# Patient Record
Sex: Female | Born: 1990 | Race: White | Hispanic: No | Marital: Single | Smoking: Current every day smoker
Health system: Southern US, Community
[De-identification: ages and names within clinical notes are randomized; demographics above are authoritative.]

## PROBLEM LIST (undated history)

## (undated) ENCOUNTER — Inpatient Hospital Stay (HOSPITAL_COMMUNITY): Payer: Self-pay

## (undated) ENCOUNTER — Inpatient Hospital Stay (HOSPITAL_COMMUNITY): Admission: AD | Payer: Medicaid Other | Admitting: Obstetrics & Gynecology

## (undated) DIAGNOSIS — R87629 Unspecified abnormal cytological findings in specimens from vagina: Secondary | ICD-10-CM

## (undated) DIAGNOSIS — Z202 Contact with and (suspected) exposure to infections with a predominantly sexual mode of transmission: Secondary | ICD-10-CM

## (undated) DIAGNOSIS — F419 Anxiety disorder, unspecified: Secondary | ICD-10-CM

## (undated) DIAGNOSIS — I10 Essential (primary) hypertension: Secondary | ICD-10-CM

## (undated) DIAGNOSIS — Z87448 Personal history of other diseases of urinary system: Secondary | ICD-10-CM

## (undated) HISTORY — PX: WISDOM TOOTH EXTRACTION: SHX21

## (undated) HISTORY — DX: Contact with and (suspected) exposure to infections with a predominantly sexual mode of transmission: Z20.2

## (undated) HISTORY — PX: TOOTH EXTRACTION: SUR596

## (undated) HISTORY — DX: Personal history of other diseases of urinary system: Z87.448

## (undated) HISTORY — PX: DILATION AND CURETTAGE OF UTERUS: SHX78

## (undated) HISTORY — DX: Anxiety disorder, unspecified: F41.9

---

## 2010-09-07 ENCOUNTER — Emergency Department (HOSPITAL_COMMUNITY)
Admission: EM | Admit: 2010-09-07 | Discharge: 2010-09-07 | Payer: Self-pay | Source: Home / Self Care | Admitting: Emergency Medicine

## 2010-09-08 ENCOUNTER — Inpatient Hospital Stay (HOSPITAL_COMMUNITY)
Admission: AD | Admit: 2010-09-08 | Discharge: 2010-09-08 | Payer: Self-pay | Source: Home / Self Care | Attending: Obstetrics and Gynecology | Admitting: Obstetrics and Gynecology

## 2010-11-23 LAB — URINALYSIS, ROUTINE W REFLEX MICROSCOPIC
Bilirubin Urine: NEGATIVE
Bilirubin Urine: NEGATIVE
Glucose, UA: NEGATIVE mg/dL
Ketones, ur: NEGATIVE mg/dL
Leukocytes, UA: NEGATIVE
Nitrite: NEGATIVE
Nitrite: NEGATIVE
Specific Gravity, Urine: 1.022 (ref 1.005–1.030)
Specific Gravity, Urine: 1.02 (ref 1.005–1.030)
pH: 5 (ref 5.0–8.0)

## 2010-11-23 LAB — URINE MICROSCOPIC-ADD ON

## 2010-11-23 LAB — COMPREHENSIVE METABOLIC PANEL
ALT: 17 U/L (ref 0–35)
Albumin: 4 g/dL (ref 3.5–5.2)
Alkaline Phosphatase: 71 U/L (ref 39–117)
BUN: 9 mg/dL (ref 6–23)
Calcium: 9 mg/dL (ref 8.4–10.5)
GFR calc Af Amer: >60 mL/min (ref >60)
GFR calc non Af Amer: >60 mL/min (ref >60)
Glucose, Bld: 106 mg/dL — ABNORMAL HIGH (ref 70–99)
Potassium: 4.2 mEq/L (ref 3.5–5.1)

## 2010-11-23 LAB — CBC
Hemoglobin: 14.3 g/dL (ref 12.0–15.0)
MCH: 30.7 pg (ref 26.0–34.0)
MCH: 30.8 pg (ref 26.0–34.0)
MCHC: 34 g/dL (ref 30.0–36.0)
MCHC: 34.6 g/dL (ref 30.0–36.0)
MCV: 90.1 fL (ref 78.0–100.0)
MCV: 89 fL (ref 78.0–100.0)
Platelets: 298 10*3/uL (ref 150–400)
Platelets: 328 10*3/uL (ref 150–400)
RBC: 4.74 MIL/uL (ref 3.87–5.11)
RDW: 12.5 % (ref 11.5–15.5)
RDW: 12.3 % (ref 11.5–15.5)

## 2010-11-23 LAB — POCT PREGNANCY, URINE: Preg Test, Ur: NEGATIVE

## 2010-11-23 LAB — DIFFERENTIAL
Eosinophils Absolute: 0 10*3/uL (ref 0.0–0.7)
Eosinophils Relative: 0 % (ref 0–5)
Lymphs Abs: 0.7 10*3/uL (ref 0.7–4.0)
Monocytes Absolute: 1.5 10*3/uL — ABNORMAL HIGH (ref 0.1–1.0)
Monocytes Relative: 9 % (ref 3–12)
Neutro Abs: 15.1 10*3/uL — ABNORMAL HIGH (ref 1.7–7.7)
Neutrophils Relative %: 87 % — ABNORMAL HIGH (ref 43–77)

## 2010-11-23 LAB — GC/CHLAMYDIA PROBE AMP, GENITAL: Chlamydia, DNA Probe: NEGATIVE

## 2010-11-23 LAB — LIPASE, BLOOD: Lipase: 38 U/L (ref 11–59)

## 2011-05-24 ENCOUNTER — Encounter (HOSPITAL_COMMUNITY): Payer: Self-pay | Admitting: *Deleted

## 2011-05-24 ENCOUNTER — Inpatient Hospital Stay (HOSPITAL_COMMUNITY)
Admission: AD | Admit: 2011-05-24 | Discharge: 2011-05-24 | Disposition: A | Discharge status: Home / Self Care | Payer: Self-pay | Source: Ambulatory Visit | Attending: Obstetrics and Gynecology | Admitting: Obstetrics and Gynecology

## 2011-05-24 DIAGNOSIS — R109 Unspecified abdominal pain: Secondary | ICD-10-CM | POA: Insufficient documentation

## 2011-05-24 DIAGNOSIS — N899 Noninflammatory disorder of vagina, unspecified: Secondary | ICD-10-CM | POA: Insufficient documentation

## 2011-05-24 DIAGNOSIS — N390 Urinary tract infection, site not specified: Secondary | ICD-10-CM

## 2011-05-24 HISTORY — DX: Essential (primary) hypertension: I10

## 2011-05-24 LAB — CBC
HCT: 41.2 % (ref 36.0–46.0)
MCH: 31.4 pg (ref 26.0–34.0)
MCV: 93.6 fL (ref 78.0–100.0)
Platelets: 329 10*3/uL (ref 150–400)
RBC: 4.4 MIL/uL (ref 3.87–5.11)

## 2011-05-24 LAB — WET PREP, GENITAL

## 2011-05-24 LAB — URINE MICROSCOPIC-ADD ON

## 2011-05-24 LAB — URINALYSIS, ROUTINE W REFLEX MICROSCOPIC
Hgb urine dipstick: NEGATIVE
Ketones, ur: NEGATIVE mg/dL
Specific Gravity, Urine: >1.03 — ABNORMAL HIGH (ref 1.005–1.030)

## 2011-05-24 LAB — POCT PREGNANCY, URINE: Preg Test, Ur: NEGATIVE

## 2011-05-24 LAB — DIFFERENTIAL
Basophils Absolute: 0 10*3/uL (ref 0.0–0.1)
Basophils Relative: 0 % (ref 0–1)
Eosinophils Absolute: 0.1 10*3/uL (ref 0.0–0.7)
Eosinophils Relative: 1 % (ref 0–5)
Lymphocytes Relative: 13 % (ref 12–46)
Lymphs Abs: 1.4 10*3/uL (ref 0.7–4.0)
Monocytes Relative: 10 % (ref 3–12)

## 2011-05-24 MED ORDER — CEPHALEXIN 500 MG PO CAPS: 500.0000 mg | ORAL_CAPSULE | Freq: Four times a day (QID) | ORAL | Status: AC

## 2011-05-24 NOTE — Discharge Instructions (Signed)
Urinary Tract Infection (UTI)   Infections of the urinary tract can start in several places. A bladder infection (cystitis), a kidney infection (pyelonephritis), and a prostate infection (prostatitis) are different types of urinary tract infections. They usually get better if treated with medicines (antibiotics) that kill germs. Take all the medicine until it is gone. You or your child may feel better in a few days, but TAKE ALL MEDICINE or the infection may not respond and may become more difficult to treat.   HOME CARE INSTRUCTIONS   Drink enough water and fluids to keep the urine clear or pale yellow. Cranberry juice is especially recommended, in addition to large amounts of water.   Avoid caffeine, tea, and carbonated beverages. They tend to irritate the bladder.   Alcohol may irritate the prostate.   Only take over-the-counter or prescription medicines for pain, discomfort, or fever as directed by your caregiver.   FINDING OUT THE RESULTS OF YOUR TEST   Not all test results are available during your visit. If your or your child's test results are not back during the visit, make an appointment with your caregiver to find out the results. Do not assume everything is normal if you have not heard from your caregiver or the medical facility. It is important for you to follow up on all test results.   TO PREVENT FURTHER INFECTIONS:   Empty the bladder often. Avoid holding urine for long periods of time.   After a bowel movement, women should cleanse from front to back. Use each tissue only once.   Empty the bladder before and after sexual intercourse.   SEEK MEDICAL CARE IF:   There is back pain.   You or your child has an oral temperature above 100.4.   Your baby is older than 3 months with a rectal temperature of 100.5º F (38.1° C) or higher for more than 1 day.   Your or your child's problems (symptoms) are no better in 3 days. Return sooner if you or your child is getting worse.   SEEK IMMEDIATE MEDICAL CARE IF:    There is severe back pain or lower abdominal pain.   You or your child develops chills.   You or your child has an oral temperature above 100.4, not controlled by medicine.   Your baby is older than 3 months with a rectal temperature of 102º F (38.9º C) or higher.   Your baby is 3 months old or younger with a rectal temperature of 100.4º F (38º C) or higher.   There is nausea or vomiting.   There is continued burning or discomfort with urination.   MAKE SURE YOU:   Understand these instructions.   Will watch this condition.   Will get help right away if you or your child is not doing well or gets worse.   Document Released: 06/09/2005 Document Re-Released: 11/24/2009   Exit[employer]® Patient Information ©2011 [employer].

## 2011-05-24 NOTE — Progress Notes (Signed)
Had infection, think it came (trich)  03/04/11.  Did not finish meds, had allergic reaction- hives- did NOT go anywhere to get med changed. Pain in back and lower stomach x5 days.  Nausea.

## 2011-05-24 NOTE — ED Provider Notes (Signed)
History   Rubee Vega is a 20yr old white female with past medical history hypertension, mirgraines and recurrent STI infection.  No chief complaint on file.  HPI  Nuriyah Hanline is a 19yr old white female presenting with 5 day history of abdominal and back pain.  Reports vaginal discharge present since the onset of pain.  Describes the discharge as "greenish" and having a foul odor. Reports history of recurrent vaginal infections since the placement of IUD a year ago, most recently she was diagnosed and treated for Trichomonas in 02/2011, admits to not completing full course of antibiotics due to adverse reactions to Flagyl. She is currently sexually active but admits to not using barrier protection with sexual partners. Abdomen pain is greatest in LRQ but present through out lower abdomen.  States the pain is aggravated by vigorous movement and intercourse. Denies bleeding after intercourse.  Reports lower back pain present since the onset, does not radiate.  5 day history of urinary frequency.  Denies urgency or dysuria.  Notes urine is dark but denies gross hematuria   LMP: 05/19/2011.  OB History    Grav Para Term Preterm Abortions TAB SAB Ect Mult Living   1         0      Past Medical History  Diagnosis Date  . Migraine   . Hypertension     Past Surgical History  Procedure Date  . Tooth extraction     No family history on file.  History  Substance Use Topics  . Smoking status: Current Everyday Smoker -- 0.5 packs/day  . Smokeless tobacco: Not on file  . Alcohol Use: Yes    Allergies:  Allergies  Allergen Reactions  . Metronidazole Hives    No prescriptions prior to admission    Review of Systems  Constitutional: Negative for fever, chills, weight loss and malaise/fatigue.  HENT: Negative.   Eyes: Negative.   Respiratory: Negative.   Cardiovascular: Negative.   Gastrointestinal: Negative.   Genitourinary: Positive for frequency and flank pain. Negative for  dysuria, urgency and hematuria.  Musculoskeletal: Positive for back pain (Bilateral lumbar back pain.).  Skin: Negative.   Neurological: Negative.   Endo/Heme/Allergies: Negative.   Psychiatric/Behavioral: Negative.    Physical Exam   Blood pressure 151/88, pulse 87, temperature 98.5 F (36.9 C), temperature source Oral, resp. rate 18, height 5\' 2"  (1.575 m), weight 198 lb 9.6 oz (90.084 kg).  Physical Exam  Constitutional: She is oriented to person, place, and time. She appears well-developed and well-nourished. No distress.       Patient appears uncomfortable laying on exam table.  Discomfort during movement.  HENT:  Head: Normocephalic and atraumatic.  Eyes: Conjunctivae and EOM are normal. Right eye exhibits no discharge. Left eye exhibits no discharge. No scleral icterus.  Neck: Normal range of motion. No tracheal deviation present. No thyromegaly present.  Cardiovascular: Normal rate, regular rhythm and intact distal pulses.  Exam reveals no gallop and no friction rub.   No murmur heard. Respiratory: Breath sounds normal. No stridor. No respiratory distress. She has no wheezes. She has no rales.  GI: Soft. Bowel sounds are normal. She exhibits no distension and no mass. There is tenderness (Tender to palpation in LRQ and LLQ. Pain greatest in LRQ.  ). There is guarding (Moderate gaurding to palation on LRQ). There is no rebound.  Genitourinary: Vagina normal and uterus normal.       White/yellow discharge present upon speculum exam.  Discharge appears to  originate from cervical os.  IUD tail present and extends approximately 3 cm from cervical os.  Cervix was non-friable and non-erythematous.  Musculoskeletal: Normal range of motion. She exhibits no edema.  Neurological: She is alert and oriented to person, place, and time. She has normal reflexes.  Skin: Skin is warm and dry. She is not diaphoretic.  Psychiatric: She has a normal mood and affect. Her behavior is normal.    MAU  Course  Procedures  MDM Vaginal discharge cultured for G/C and wet prep.  Wet prep reveals few clue cells, few WBC but no trichomonas.   UA and urine culture for assessment of urinary frequency.   RLQ Abdominal pain.  CBC for evaluation of WBC. Patient has been afebrile since onset of symptoms and arrival at MAU.  WBC reveals only mild elevation in WBC to 10.9  Assessment and Plan  Ms. Stolar is a 20 y/o female who presents with a 5 day history of abdominal pain, back pain and vaginal discharge.   Also complains for urinary frequency x5 days.   1) UTI- Cephalexin 2) Vaginal Discharge- Awaiting results from G/C culture. Treatment pending results. Patient counseled to avoid sexual contact until culture results have been received.  3) Abdominal Pain-  Advised to monitor for fever and return to seek medical attention if she becomes afebrile or pain worsens.  Adam Chenevert PA-S 05/24/2011, 1:46 PM   Henrietta Hoover, PA 05/24/11 1454

## 2011-05-25 LAB — URINE CULTURE: Culture  Setup Time: 201209110125

## 2011-05-27 ENCOUNTER — Telehealth: Payer: Self-pay

## 2011-05-27 NOTE — Telephone Encounter (Signed)
Pt called wanting results from her visit on Monday 05/24/11.

## 2011-05-27 NOTE — Telephone Encounter (Signed)
Called pt and pt asked about wet prep results.  I informed pt that her results were few and asked if she had any symptoms she stated use.  I asked if they given her anything during her visit she said yes and that she has started taken it.  Medication prescribed was Keflex 4 x daily for 7 days. Pt stated understanding.

## 2011-05-27 NOTE — ED Provider Notes (Signed)
Agree with above note.  Kerry Richards 05/27/2011 8:56 AM   

## 2011-08-12 ENCOUNTER — Inpatient Hospital Stay (HOSPITAL_COMMUNITY): Payer: Self-pay

## 2011-08-12 ENCOUNTER — Encounter (HOSPITAL_COMMUNITY): Payer: Self-pay | Admitting: *Deleted

## 2011-08-12 ENCOUNTER — Inpatient Hospital Stay (HOSPITAL_COMMUNITY)
Admission: AD | Admit: 2011-08-12 | Discharge: 2011-08-12 | Disposition: A | Discharge status: Home / Self Care | Payer: Self-pay | Source: Ambulatory Visit | Attending: Obstetrics & Gynecology | Admitting: Obstetrics & Gynecology

## 2011-08-12 DIAGNOSIS — Z30431 Encounter for routine checking of intrauterine contraceptive device: Secondary | ICD-10-CM | POA: Insufficient documentation

## 2011-08-12 DIAGNOSIS — R197 Diarrhea, unspecified: Secondary | ICD-10-CM | POA: Insufficient documentation

## 2011-08-12 DIAGNOSIS — R112 Nausea with vomiting, unspecified: Secondary | ICD-10-CM | POA: Insufficient documentation

## 2011-08-12 DIAGNOSIS — N949 Unspecified condition associated with female genital organs and menstrual cycle: Secondary | ICD-10-CM | POA: Insufficient documentation

## 2011-08-12 DIAGNOSIS — N72 Inflammatory disease of cervix uteri: Secondary | ICD-10-CM

## 2011-08-12 DIAGNOSIS — R109 Unspecified abdominal pain: Secondary | ICD-10-CM | POA: Insufficient documentation

## 2011-08-12 DIAGNOSIS — Z30432 Encounter for removal of intrauterine contraceptive device: Secondary | ICD-10-CM

## 2011-08-12 LAB — COMPREHENSIVE METABOLIC PANEL
ALT: 12 U/L (ref 0–35)
Albumin: 3.6 g/dL (ref 3.5–5.2)
Alkaline Phosphatase: 69 U/L (ref 39–117)
BUN: 11 mg/dL (ref 6–23)
Chloride: 101 mEq/L (ref 96–112)
Potassium: 3.6 mEq/L (ref 3.5–5.1)
Total Bilirubin: 0.2 mg/dL — ABNORMAL LOW (ref 0.3–1.2)

## 2011-08-12 LAB — URINE MICROSCOPIC-ADD ON

## 2011-08-12 LAB — CBC
MCH: 30.3 pg (ref 26.0–34.0)
MCHC: 33.5 g/dL (ref 30.0–36.0)
MCV: 90.4 fL (ref 78.0–100.0)
Platelets: 254 10*3/uL (ref 150–400)
RDW: 13.2 % (ref 11.5–15.5)
WBC: 6.2 10*3/uL (ref 4.0–10.5)

## 2011-08-12 LAB — URINALYSIS, ROUTINE W REFLEX MICROSCOPIC
Glucose, UA: NEGATIVE mg/dL
Specific Gravity, Urine: >1.03 — ABNORMAL HIGH (ref 1.005–1.030)
Urobilinogen, UA: 1 mg/dL (ref 0.0–1.0)

## 2011-08-12 LAB — WET PREP, GENITAL: Yeast Wet Prep HPF POC: NONE SEEN

## 2011-08-12 LAB — POCT PREGNANCY, URINE: Preg Test, Ur: NEGATIVE

## 2011-08-12 MED ORDER — AZITHROMYCIN 1 G PO PACK
1.0000 g | PACK | Freq: Once | ORAL | Status: AC
  Administered 2011-08-12: 1 g via ORAL
  Filled 2011-08-12: qty 1

## 2011-08-12 MED ORDER — NORGESTIMATE-ETH ESTRADIOL 0.25-35 MG-MCG PO TABS: 1.0000 | ORAL_TABLET | Freq: Every day | ORAL | Status: DC

## 2011-08-12 MED ORDER — CEFIXIME 400 MG PO TABS
400.0000 mg | ORAL_TABLET | Freq: Once | ORAL | Status: AC
Start: 2011-08-12 — End: 2011-08-12
  Administered 2011-08-12: 400 mg via ORAL
  Filled 2011-08-12: qty 1

## 2011-08-12 MED ORDER — PROMETHAZINE HCL 25 MG PO TABS: 25.0000 mg | ORAL_TABLET | Freq: Four times a day (QID) | ORAL | Status: DC | PRN

## 2011-08-12 MED ORDER — ONDANSETRON 8 MG PO TBDP
8.0000 mg | ORAL_TABLET | Freq: Once | ORAL | Status: AC
  Administered 2011-08-12: 8 mg via ORAL
  Filled 2011-08-12: qty 1

## 2011-08-12 MED ORDER — KETOROLAC TROMETHAMINE 60 MG/2ML IM SOLN: 60.0000 mg | Freq: Once | INTRAMUSCULAR | Status: DC

## 2011-08-12 MED ORDER — OXYCODONE-ACETAMINOPHEN 5-325 MG PO TABS
2.0000 | ORAL_TABLET | Freq: Once | ORAL | Status: AC
  Administered 2011-08-12: 2 via ORAL
  Filled 2011-08-12: qty 2

## 2011-08-12 NOTE — Progress Notes (Signed)
Patient states that she had a Periguard IUD [facility] about one [facility]. Has had problems with it the entire time. Has had nausea and vomiting for 3 days and had diarrhea for two days, none today. Has been having pain with intercourse, extreme pain when putting in a tampon to the point of vomiting. Having vaginal pain with odor to blood.

## 2011-08-13 LAB — GC/CHLAMYDIA PROBE AMP, GENITAL: GC Probe Amp, Genital: NEGATIVE

## 2011-10-09 ENCOUNTER — Encounter (HOSPITAL_COMMUNITY): Payer: Self-pay

## 2011-10-09 ENCOUNTER — Inpatient Hospital Stay (HOSPITAL_COMMUNITY)
Admission: AD | Admit: 2011-10-09 | Discharge: 2011-10-09 | Disposition: A | Discharge status: Home / Self Care | Payer: Medicaid Other | Source: Ambulatory Visit | Attending: Obstetrics & Gynecology | Admitting: Obstetrics & Gynecology

## 2011-10-09 DIAGNOSIS — I10 Essential (primary) hypertension: Secondary | ICD-10-CM

## 2011-10-09 DIAGNOSIS — O10019 Pre-existing essential hypertension complicating pregnancy, unspecified trimester: Secondary | ICD-10-CM | POA: Insufficient documentation

## 2011-10-09 LAB — URINALYSIS, ROUTINE W REFLEX MICROSCOPIC
Bilirubin Urine: NEGATIVE
Ketones, ur: NEGATIVE mg/dL
Nitrite: NEGATIVE
Protein, ur: NEGATIVE mg/dL
Urobilinogen, UA: 1 mg/dL (ref 0.0–1.0)

## 2011-10-09 NOTE — ED Provider Notes (Signed)
History    Pt presents with report of feeling heart beating faster when bending over and shortness of breath and is worried that her BP is up.  She recently stopped taking her prescribed HCTZ per her PCP r/t the pregnancy.     HPI  OB History    Grav Para Term Preterm Abortions TAB SAB Ect Mult Living   2    1 1     0      Past Medical History  Diagnosis Date  . Migraine   . Hypertension     Past Surgical History  Procedure Date  . Tooth extraction   . Wisdom tooth extraction     History reviewed. No pertinent family history.  History  Substance Use Topics  . Smoking status: Current Everyday Smoker -- 0.5 packs/day    Types: Cigarettes  . Smokeless tobacco: Never Used  . Alcohol Use: 1.2 oz/week    2 Cans of beer per week     every other day    Allergies:  Allergies  Allergen Reactions  . Shellfish Allergy Anaphylaxis  . Metronidazole Hives    Prescriptions prior to admission  Medication Sig Dispense Refill  . hydrochlorothiazide (HYDRODIURIL) 12.5 MG tablet Take 12.5 mg by mouth daily.      . Prenatal Vit-Fe Fumarate-FA (PRENATAL MULTIVITAMIN) TABS Take 1 tablet by mouth daily.        Review of Systems  Constitutional: Negative.   HENT: Negative.   Eyes: Negative.   Respiratory: Negative.   Cardiovascular: Negative.   Gastrointestinal: Negative.   Genitourinary: Negative.   Musculoskeletal: Negative.   Skin: Negative.   Neurological: Negative.   Endo/Heme/Allergies: Negative.   Psychiatric/Behavioral: Negative.    Physical Exam   Blood pressure 131/58, pulse 98, temperature 98.9 F (37.2 C), temperature source Oral, resp. rate 16, height 5\' 2"  (1.575 m), weight 87.998 kg (194 lb), last menstrual period 09/04/2011.  Physical Exam  Constitutional: She is oriented to person, place, and time. She appears well-developed and well-nourished.  Neck: Normal range of motion.  Cardiovascular: Normal rate and regular rhythm.   Respiratory: Effort normal  and breath sounds normal.  GI: Soft. Bowel sounds are normal.  Neurological: She is alert and oriented to person, place, and time. She has normal reflexes.  Skin: Skin is warm and dry.  Psychiatric: She has a normal mood and affect. Her behavior is normal. Judgment and thought content normal.    MAU Course  Procedures   Assessment and Plan  A:  Chronic HTN  P: D/C home F/U with health department for prenatal care, evaluation of blood pressure throughout pregnancy Return to MAU if worsening symptoms   LEFTWICH-KIRBY, Laird Runnion 10/09/2011, 2:07 PM

## 2011-10-09 NOTE — Progress Notes (Signed)
Was seen at [facility] positive home pregnancy test, LMP 09/04/11 was taking HCTZ for blood pressure was told to stop, feels like her pressure is up, needs pregnancy verification.

## 2011-10-09 NOTE — ED Provider Notes (Signed)
Attestation of Attending Supervision of Advanced Practitioner: Evaluation and management procedures were performed by the PA/NP/CNM/OB Fellow under my supervision/collaboration. Chart reviewed, and agree with management and plan.  Jaynie Collins, M.D. 10/09/2011 6:09 PM

## 2011-10-09 NOTE — Progress Notes (Signed)
Concerned over being taken off BP meds and having no prenatal provider to order meds.

## 2011-10-15 ENCOUNTER — Inpatient Hospital Stay (HOSPITAL_COMMUNITY): Payer: Medicaid Other

## 2011-10-15 ENCOUNTER — Inpatient Hospital Stay (HOSPITAL_COMMUNITY)
Admission: AD | Admit: 2011-10-15 | Discharge: 2011-10-15 | Disposition: A | Discharge status: Home / Self Care | Payer: Medicaid Other | Source: Ambulatory Visit | Attending: Obstetrics & Gynecology | Admitting: Obstetrics & Gynecology

## 2011-10-15 ENCOUNTER — Encounter (HOSPITAL_COMMUNITY): Payer: Self-pay | Admitting: *Deleted

## 2011-10-15 DIAGNOSIS — O200 Threatened abortion: Secondary | ICD-10-CM

## 2011-10-15 LAB — URINALYSIS, ROUTINE W REFLEX MICROSCOPIC
Bilirubin Urine: NEGATIVE
Nitrite: NEGATIVE
Protein, ur: NEGATIVE mg/dL
Specific Gravity, Urine: <1.005 — ABNORMAL LOW (ref 1.005–1.030)
Urobilinogen, UA: 0.2 mg/dL (ref 0.0–1.0)

## 2011-10-15 LAB — URINE MICROSCOPIC-ADD ON

## 2011-10-15 LAB — CBC
MCH: 30.9 pg (ref 26.0–34.0)
Platelets: 284 10*3/uL (ref 150–400)

## 2011-10-15 NOTE — Progress Notes (Signed)
Woke up this morning with some bleeding, now cramping.  1st appt  [facility] on 02/06

## 2011-10-15 NOTE — ED Notes (Signed)
Pt anxious, tearful.  Visitor is supportive.  Acknowledged fear.  Discussed plan with pt.  Was here on Sun, preg confirmation only.  Explained will be doing blood work, to check blood type and hormone level; pelvic- to check for infection and assess bleeding and and [place].

## 2011-10-16 LAB — GC/CHLAMYDIA PROBE AMP, GENITAL
Chlamydia, DNA Probe: NEGATIVE
GC Probe Amp, Genital: NEGATIVE

## 2012-01-17 LAB — OB RESULTS CONSOLE ABO/RH: RH Type: POSITIVE

## 2012-01-17 LAB — OB RESULTS CONSOLE HEPATITIS B SURFACE ANTIGEN: Hepatitis B Surface Ag: NEGATIVE

## 2012-01-17 LAB — OB RESULTS CONSOLE GC/CHLAMYDIA: Chlamydia: NEGATIVE

## 2012-01-17 LAB — OB RESULTS CONSOLE ANTIBODY SCREEN: Antibody Screen: NEGATIVE

## 2012-01-17 LAB — OB RESULTS CONSOLE RUBELLA ANTIBODY, IGM: Rubella: IMMUNE

## 2012-06-02 ENCOUNTER — Inpatient Hospital Stay (HOSPITAL_COMMUNITY)
Admission: AD | Admit: 2012-06-02 | Discharge: 2012-06-02 | Disposition: A | Discharge status: Home / Self Care | Payer: Medicaid Other | Source: Ambulatory Visit | Attending: Obstetrics and Gynecology | Admitting: Obstetrics and Gynecology

## 2012-06-02 ENCOUNTER — Encounter (HOSPITAL_COMMUNITY): Payer: Self-pay

## 2012-06-02 DIAGNOSIS — O2650 Maternal hypotension syndrome, unspecified trimester: Secondary | ICD-10-CM | POA: Insufficient documentation

## 2012-06-02 DIAGNOSIS — R42 Dizziness and giddiness: Secondary | ICD-10-CM

## 2012-06-02 DIAGNOSIS — R55 Syncope and collapse: Secondary | ICD-10-CM

## 2012-06-02 DIAGNOSIS — O10019 Pre-existing essential hypertension complicating pregnancy, unspecified trimester: Secondary | ICD-10-CM | POA: Insufficient documentation

## 2012-06-02 DIAGNOSIS — Z331 Pregnant state, incidental: Secondary | ICD-10-CM

## 2012-06-02 LAB — BASIC METABOLIC PANEL
BUN: 7 mg/dL (ref 6–23)
Calcium: 9 mg/dL (ref 8.4–10.5)
GFR calc Af Amer: >90 mL/min (ref >90)
GFR calc non Af Amer: >90 mL/min (ref >90)
Potassium: 3.8 mEq/L (ref 3.5–5.1)

## 2012-06-02 LAB — CBC
HCT: 34.4 % — ABNORMAL LOW (ref 36.0–46.0)
MCH: 30.2 pg (ref 26.0–34.0)
MCHC: 33.7 g/dL (ref 30.0–36.0)
RDW: 12.4 % (ref 11.5–15.5)

## 2012-06-02 MED ORDER — ACETAMINOPHEN 500 MG PO TABS
1000.0000 mg | ORAL_TABLET | Freq: Once | ORAL | Status: AC
  Administered 2012-06-02: 1000 mg via ORAL
  Filled 2012-06-02: qty 2

## 2012-06-02 NOTE — MAU Note (Signed)
Pt states around she felt faint earlier and started seeing stars. Denies any bleeding or leakage of fluid

## 2012-06-02 NOTE — Progress Notes (Signed)
FHT from today reviewed.  Reactive NST, no significant ctx. 

## 2012-06-02 NOTE — MAU Provider Note (Signed)
Chief Complaint:  seeing stars    First Provider Initiated Contact with Patient 06/02/12 1256      HPI: Kerry Richards is a 21 y.o. G2P0010 at [redacted]w[redacted]d who felt faint and was "seeing stars" this morning. No hx syncope except blacking out once at age 20 when she was getting blood drawn. Did not take her Aldomet today. Has intermittent H/A.  Denies contractions, leakage of fluid or vaginal bleeding. Good fetal movement.   Pregnancy Course: complicated by chronic hypertension on Aldomet since age 1  Past Medical History: Past Medical History  Diagnosis Date  . Migraine   . Hypertension     Past obstetric history: OB History    Grav Para Term Preterm Abortions TAB SAB Ect Mult Living   3 0 0 0 1 1 0   0     # Outc Date GA Lbr Len/2nd Wgt Sex Del Anes PTL Lv   1 TAB            2 GRA            3 CUR               Past Surgical History: Past Surgical History  Procedure Date  . Tooth extraction   . Wisdom tooth extraction     Family History: Family History  Problem Relation Age of Onset  . Anesthesia problems Neg Hx   . Other Neg Hx     Social History: History  Substance Use Topics  . Smoking status: Former Smoker -- 0.5 packs/day    Types: Cigarettes    Quit date: 09/30/2011  . Smokeless tobacco: Never Used  . Alcohol Use: No    Allergies:  Allergies  Allergen Reactions  . Shellfish Allergy Anaphylaxis  . Metronidazole Hives    Meds:  Prescriptions prior to admission  Medication Sig Dispense Refill  . calcium carbonate (TUMS - DOSED IN MG ELEMENTAL CALCIUM) 500 MG chewable tablet Chew 1 tablet by mouth daily as needed. For heartburn.      . methyldopa (ALDOMET) 250 MG tablet Take 250 mg by mouth 3 (three) times daily.      . Prenatal Vit-Fe Fumarate-FA (PRENATAL MULTIVITAMIN) TABS Take 1 tablet by mouth daily.      . TDaP (BOOSTRIX) 5-2.5-18.5 LF-MCG/0.5 injection Inject 0.5 mLs into the muscle once.        ROS: Pertinent findings in history of present  illness.  Physical Exam  Blood pressure 137/76, pulse 98, temperature 98.2 F (36.8 C), temperature source Oral, resp. rate 20, last menstrual period 11/06/2011, SpO2 100.00%, unknown if currently breastfeeding.  Orthostatic: BP stable, pulse 90-99-106 GENERAL: Well-developed, well-nourished female in no acute distress.  HEENT: normocephalic HEART: RRR, no murmur RESP: normal effort, CTA bilat ABDOMEN: Soft, non-tender, gravid appropriate for gestational age EXTREMITIES: Nontender, no edema NEURO: alert and oriented   FHT:  Baseline 130 , moderate variability, accelerations present, no decelerations Contractions: none   Labs: Results for orders placed during the hospital encounter of 06/02/12 (from the past 24 hour(s))  CBC     Status: Abnormal   Collection Time   06/02/12  1:09 PM      Component Value Range   WBC 12.9 (*) 4.0 - 10.5 K/uL   RBC 3.84 (*) 3.87 - 5.11 MIL/uL   Hemoglobin 11.6 (*) 12.0 - 15.0 g/dL   HCT 16.1 (*) 09.6 - 04.5 %   MCV 89.6  78.0 - 100.0 fL   MCH 30.2  26.0 -  34.0 pg   MCHC 33.7  30.0 - 36.0 g/dL   RDW 16.1  09.6 - 04.5 %   Platelets 259  150 - 400 K/uL  BASIC METABOLIC PANEL     Status: Abnormal   Collection Time   06/02/12  1:09 PM      Component Value Range   Sodium 136  135 - 145 mEq/L   Potassium 3.8  3.5 - 5.1 mEq/L   Chloride 101  96 - 112 mEq/L   CO2 23  19 - 32 mEq/L   Glucose, Bld 104 (*) 70 - 99 mg/dL   BUN 7  6 - 23 mg/dL   Creatinine, Ser 4.09 (*) 0.50 - 1.10 mg/dL   Calcium 9.0  8.4 - 81.1 mg/dL   GFR calc non Af Amer >90  >90 mL/min   GFR calc Af Amer >90  >90 mL/min    Imaging:  No results found. ED Course Received acetaminophen and po fluids -> feels better  Assessment: 1. Near syncope    G2P0010 at 30wks Cat 1 FHR tracing  Plan: Discharge home with near syncope info  C/W Dr. Jackelyn Knife    Medication List     As of 06/02/2012  2:00 PM    TAKE these medications         calcium carbonate 500 MG chewable  tablet   Commonly known as: TUMS - dosed in mg elemental calcium   Chew 1 tablet by mouth daily as needed. For heartburn.      methyldopa 250 MG tablet   Commonly known as: ALDOMET   Take 250 mg by mouth 3 (three) times daily.      prenatal multivitamin Tabs   Take 1 tablet by mouth daily.      TDaP 5-2.5-18.5 LF-MCG/0.5 injection   Commonly known as: BOOSTRIX   Inject 0.5 mLs into the muscle once.          Danae Orleans, CNM 06/02/2012 12:58 PM

## 2012-08-08 ENCOUNTER — Other Ambulatory Visit: Payer: Self-pay | Admitting: Obstetrics and Gynecology

## 2012-08-11 ENCOUNTER — Inpatient Hospital Stay (HOSPITAL_COMMUNITY)
Admission: AD | Admit: 2012-08-11 | Discharge: 2012-08-11 | Disposition: A | Discharge status: Home / Self Care | Payer: Medicaid Other | Source: Ambulatory Visit | Attending: Obstetrics and Gynecology | Admitting: Obstetrics and Gynecology

## 2012-08-11 ENCOUNTER — Encounter (HOSPITAL_COMMUNITY): Payer: Self-pay | Admitting: *Deleted

## 2012-08-11 DIAGNOSIS — O368390 Maternal care for abnormalities of the fetal heart rate or rhythm, unspecified trimester, not applicable or unspecified: Secondary | ICD-10-CM | POA: Insufficient documentation

## 2012-08-11 NOTE — MAU Note (Signed)
For OP NST due to office holiday closing, no beds and patients waiting.  Pt will go home and we will call her

## 2012-08-14 ENCOUNTER — Encounter (HOSPITAL_COMMUNITY): Payer: Self-pay | Admitting: *Deleted

## 2012-08-14 ENCOUNTER — Telehealth (HOSPITAL_COMMUNITY): Payer: Self-pay | Admitting: *Deleted

## 2012-08-14 NOTE — Telephone Encounter (Signed)
Preadmission screen  

## 2012-08-23 NOTE — H&P (Signed)
Kerry Richards is a 21 y.o. female G3P0020 at 29 1/7 weeks (EDD 08/23/12 by 6 week [place] ) presenting for IOL post her due date.  Prenatal care complicated by [facility], well-controlled on aldomet q day throughout pregnancy.  SHe has been followed with NST's 2x weekly since 34 weeks. No other issues.  Maternal Medical History:  Contractions: Frequency: irregular.   Perceived severity is mild.    Fetal activity: Perceived fetal activity is normal.    Prenatal complications: Hypertension.     OB History    Grav Para Term Preterm Abortions TAB SAB Ect Mult Living   3 0 0 0 2 1 1    0    EAB x 1 SAB x 1  Past Medical History  Diagnosis Date  . Migraine   . Hypertension   . Anxiety   . Trichomonas contact, treated   . Hx of pyelonephritis    Past Surgical History  Procedure Date  . Tooth extraction   . Wisdom tooth extraction   . Dilation and curettage of uterus    Family History: family history includes Arthritis in her maternal aunt and maternal grandmother; Cancer in her maternal grandmother; Diabetes in her paternal grandfather; Heart disease in her paternal aunt and paternal grandfather; Hypertension in her father and paternal grandfather; Migraines in her father; and Stroke in her father.  There is no history of Anesthesia problems and Other. Social History:  reports that she quit smoking about 10 months ago. Her smoking use included Cigarettes. She smoked .5 packs per day. She has never used smokeless tobacco. She reports that she does not drink alcohol or use illicit drugs.   Prenatal Transfer Tool  Maternal Diabetes: No Genetic Screening: Normal Maternal Ultrasounds/Referrals: Normal Fetal Ultrasounds or other Referrals:  None Maternal Substance Abuse:  No Significant Maternal Medications:  Meds include: Other: Aldomet Significant Maternal Lab Results:  None Other Comments:  None  Review of Systems  Neurological: Negative for headaches.      Last menstrual period  11/06/2011, unknown if currently breastfeeding. Maternal Exam:  Uterine Assessment: Contraction strength is mild.  Contraction frequency is rare.   Abdomen: Patient reports no abdominal tenderness. Fetal presentation: vertex  Introitus: Normal vulva. Normal vagina.    Physical Exam  Constitutional: She is oriented to person, place, and time. She appears well-developed and well-nourished.  Cardiovascular: Normal rate and regular rhythm.   Respiratory: Effort normal and breath sounds normal.  GI: Soft. Bowel sounds are normal.  Genitourinary: Vagina normal and uterus normal.  Musculoskeletal: Normal range of motion.  Neurological: She is alert and oriented to person, place, and time.  Psychiatric: She has a normal mood and affect. Her behavior is normal.    Prenatal labs: ABO, Rh: A/Positive/-- (05/06 0000) Antibody: Negative (05/06 0000) Rubella: Immune (05/06 0000) RPR: Nonreactive (05/06 0000)  HBsAg: Negative (05/06 0000)  HIV: Non-reactive (05/06 0000)  GBS: Negative (11/11 0000)  First trimester screen and AFP WNL One hour GTT 113  Assessment/Plan: Pt for induction of labor post due date.  Cervix is favorable and pregnancy complicated by [facility].  Stable on aldomet q day.  Will begin pitocin and plan AROM.   Causby Pila 08/23/2012, 10:47 PM

## 2012-08-24 ENCOUNTER — Inpatient Hospital Stay (HOSPITAL_COMMUNITY): Payer: Medicaid Other | Admitting: Anesthesiology

## 2012-08-24 ENCOUNTER — Encounter (HOSPITAL_COMMUNITY): Payer: Self-pay | Admitting: Anesthesiology

## 2012-08-24 ENCOUNTER — Encounter (HOSPITAL_COMMUNITY): Payer: Self-pay

## 2012-08-24 ENCOUNTER — Inpatient Hospital Stay (HOSPITAL_COMMUNITY)
Admission: RE | Admit: 2012-08-24 | Discharge: 2012-08-28 | DRG: 765 | Disposition: A | Discharge status: Home / Self Care | Payer: Medicaid Other | Source: Ambulatory Visit | Attending: Obstetrics and Gynecology | Admitting: Obstetrics and Gynecology

## 2012-08-24 ENCOUNTER — Encounter (HOSPITAL_COMMUNITY)
Admission: RE | Disposition: A | Discharge status: Home / Self Care | Payer: Self-pay | Source: Ambulatory Visit | Attending: Obstetrics and Gynecology

## 2012-08-24 VITALS — BP 119/78 | HR 78 | Temp 97.7°F | Resp 20 | Ht 63.0 in | Wt 248.0 lb

## 2012-08-24 DIAGNOSIS — O324XX0 Maternal care for high head at term, not applicable or unspecified: Secondary | ICD-10-CM | POA: Diagnosis present

## 2012-08-24 DIAGNOSIS — O1002 Pre-existing essential hypertension complicating childbirth: Secondary | ICD-10-CM | POA: Diagnosis present

## 2012-08-24 DIAGNOSIS — O480 Post-term pregnancy: Principal | ICD-10-CM | POA: Diagnosis present

## 2012-08-24 DIAGNOSIS — Z98891 History of uterine scar from previous surgery: Secondary | ICD-10-CM

## 2012-08-24 LAB — CBC
Hemoglobin: 12.1 g/dL (ref 12.0–15.0)
MCH: 28.6 pg (ref 26.0–34.0)
MCHC: 33.5 g/dL (ref 30.0–36.0)
MCV: 85.8 fL (ref 78.0–100.0)
Platelets: 288 10*3/uL (ref 150–400)
Platelets: 287 10*3/uL (ref 150–400)
RBC: 4.23 MIL/uL (ref 3.87–5.11)
RDW: 13.6 % (ref 11.5–15.5)
WBC: 10.2 10*3/uL (ref 4.0–10.5)

## 2012-08-24 LAB — RPR: RPR Ser Ql: NONREACTIVE

## 2012-08-24 SURGERY — Surgical Case
Anesthesia: Epidural | Site: Abdomen | Wound class: Clean Contaminated

## 2012-08-24 MED ORDER — LACTATED RINGERS IV SOLN: 500.0000 mL | INTRAVENOUS | Status: DC | PRN

## 2012-08-24 MED ORDER — MEPERIDINE HCL 25 MG/ML IJ SOLN
INTRAMUSCULAR | Status: AC
  Filled 2012-08-24: qty 1

## 2012-08-24 MED ORDER — KETOROLAC TROMETHAMINE 30 MG/ML IJ SOLN
30.0000 mg | Freq: Four times a day (QID) | INTRAMUSCULAR | Status: AC | PRN
  Administered 2012-08-25: 30 mg via INTRAMUSCULAR

## 2012-08-24 MED ORDER — OXYTOCIN 40 UNITS IN LACTATED RINGERS INFUSION - SIMPLE MED
1.0000 m[IU]/min | INTRAVENOUS | Status: DC
  Administered 2012-08-24: 2 m[IU]/min via INTRAVENOUS
  Filled 2012-08-24: qty 1000

## 2012-08-24 MED ORDER — OXYTOCIN BOLUS FROM INFUSION: 500.0000 mL | INTRAVENOUS | Status: DC

## 2012-08-24 MED ORDER — PHENYLEPHRINE 40 MCG/ML (10ML) SYRINGE FOR IV PUSH (FOR BLOOD PRESSURE SUPPORT)
80.0000 ug | PREFILLED_SYRINGE | INTRAVENOUS | Status: DC | PRN
  Administered 2012-08-24: 80 ug via INTRAVENOUS
  Filled 2012-08-24: qty 5

## 2012-08-24 MED ORDER — IBUPROFEN 600 MG PO TABS: 600.0000 mg | ORAL_TABLET | Freq: Four times a day (QID) | ORAL | Status: DC | PRN

## 2012-08-24 MED ORDER — FENTANYL CITRATE 0.05 MG/ML IJ SOLN
25.0000 ug | INTRAMUSCULAR | Status: DC | PRN
  Administered 2012-08-25: 50 ug via INTRAVENOUS

## 2012-08-24 MED ORDER — TERBUTALINE SULFATE 1 MG/ML IJ SOLN: 0.2500 mg | Freq: Once | INTRAMUSCULAR | Status: DC | PRN

## 2012-08-24 MED ORDER — CEFAZOLIN SODIUM-DEXTROSE 2-3 GM-% IV SOLR
2.0000 g | Freq: Once | INTRAVENOUS | Status: AC
  Administered 2012-08-24: 2 g via INTRAVENOUS
  Filled 2012-08-24: qty 50

## 2012-08-24 MED ORDER — EPHEDRINE 5 MG/ML INJ
10.0000 mg | INTRAVENOUS | Status: DC | PRN
  Filled 2012-08-24: qty 4

## 2012-08-24 MED ORDER — LIDOCAINE HCL (PF) 1 % IJ SOLN
30.0000 mL | INTRAMUSCULAR | Status: DC | PRN
  Filled 2012-08-24: qty 30

## 2012-08-24 MED ORDER — CITRIC ACID-SODIUM CITRATE 334-500 MG/5ML PO SOLN
30.0000 mL | ORAL | Status: DC | PRN
  Administered 2012-08-24: 30 mL via ORAL
  Filled 2012-08-24: qty 15

## 2012-08-24 MED ORDER — KETOROLAC TROMETHAMINE 30 MG/ML IJ SOLN: 30.0000 mg | Freq: Four times a day (QID) | INTRAMUSCULAR | Status: AC | PRN

## 2012-08-24 MED ORDER — MEPERIDINE HCL 25 MG/ML IJ SOLN: 6.2500 mg | INTRAMUSCULAR | Status: DC | PRN

## 2012-08-24 MED ORDER — LACTATED RINGERS IV SOLN
500.0000 mL | Freq: Once | INTRAVENOUS | Status: AC
  Administered 2012-08-24: 500 mL via INTRAVENOUS

## 2012-08-24 MED ORDER — LIDOCAINE HCL (PF) 1 % IJ SOLN
INTRAMUSCULAR | Status: DC | PRN
  Administered 2012-08-24 (×2): 5 mL

## 2012-08-24 MED ORDER — MORPHINE SULFATE 0.5 MG/ML IJ SOLN
INTRAMUSCULAR | Status: AC
  Filled 2012-08-24: qty 10

## 2012-08-24 MED ORDER — 0.9 % SODIUM CHLORIDE (POUR BTL) OPTIME
TOPICAL | Status: DC | PRN
  Administered 2012-08-24: 1000 mL

## 2012-08-24 MED ORDER — PHENYLEPHRINE 40 MCG/ML (10ML) SYRINGE FOR IV PUSH (FOR BLOOD PRESSURE SUPPORT): 80.0000 ug | PREFILLED_SYRINGE | INTRAVENOUS | Status: DC | PRN

## 2012-08-24 MED ORDER — LACTATED RINGERS IV SOLN
INTRAVENOUS | Status: DC | PRN
  Administered 2012-08-24 (×2): via INTRAVENOUS

## 2012-08-24 MED ORDER — LACTATED RINGERS IV SOLN
INTRAVENOUS | Status: DC
  Administered 2012-08-24 (×3): via INTRAVENOUS

## 2012-08-24 MED ORDER — SODIUM BICARBONATE 8.4 % IV SOLN
INTRAVENOUS | Status: DC | PRN
  Administered 2012-08-24: 4 mL via EPIDURAL

## 2012-08-24 MED ORDER — FENTANYL 2.5 MCG/ML BUPIVACAINE 1/10 % EPIDURAL INFUSION (WH - ANES)
14.0000 mL/h | INTRAMUSCULAR | Status: DC
  Administered 2012-08-24 (×2): 14 mL/h via EPIDURAL
  Filled 2012-08-24 (×2): qty 125

## 2012-08-24 MED ORDER — ACETAMINOPHEN 325 MG PO TABS: 650.0000 mg | ORAL_TABLET | ORAL | Status: DC | PRN

## 2012-08-24 MED ORDER — OXYTOCIN 40 UNITS IN LACTATED RINGERS INFUSION - SIMPLE MED: 62.5000 mL/h | INTRAVENOUS | Status: DC

## 2012-08-24 MED ORDER — BUTORPHANOL TARTRATE 1 MG/ML IJ SOLN
1.0000 mg | INTRAMUSCULAR | Status: DC | PRN
  Administered 2012-08-24: 1 mg via INTRAVENOUS
  Filled 2012-08-24: qty 1

## 2012-08-24 MED ORDER — SCOPOLAMINE 1 MG/3DAYS TD PT72
1.0000 | MEDICATED_PATCH | Freq: Once | TRANSDERMAL | Status: AC
  Administered 2012-08-25: 1.5 mg via TRANSDERMAL

## 2012-08-24 MED ORDER — ONDANSETRON HCL 4 MG/2ML IJ SOLN: 4.0000 mg | Freq: Four times a day (QID) | INTRAMUSCULAR | Status: DC | PRN

## 2012-08-24 MED ORDER — OXYCODONE-ACETAMINOPHEN 5-325 MG PO TABS: 1.0000 | ORAL_TABLET | ORAL | Status: DC | PRN

## 2012-08-24 MED ORDER — DIPHENHYDRAMINE HCL 50 MG/ML IJ SOLN
12.5000 mg | INTRAMUSCULAR | Status: DC | PRN
Start: 2012-08-24 — End: 2012-08-24

## 2012-08-24 SURGICAL SUPPLY — 37 items
BENZOIN TINCTURE PRP APPL 2/3 (GAUZE/BANDAGES/DRESSINGS) ×2 IMPLANT
CLOTH BEACON ORANGE TIMEOUT ST (SAFETY) ×2 IMPLANT
CONTAINER PREFILL 10% NBF 15ML (MISCELLANEOUS) IMPLANT
DRAPE LG THREE QUARTER DISP (DRAPES) IMPLANT
DRSG OPSITE POSTOP 4X10 (GAUZE/BANDAGES/DRESSINGS) ×4 IMPLANT
DURAPREP 26ML APPLICATOR (WOUND CARE) IMPLANT
ELECT REM PT RETURN 9FT ADLT (ELECTROSURGICAL) ×2
ELECTRODE REM PT RTRN 9FT ADLT (ELECTROSURGICAL) ×1 IMPLANT
EXTRACTOR VACUUM KIWI (MISCELLANEOUS) IMPLANT
EXTRACTOR VACUUM M CUP 4 TUBE (SUCTIONS) IMPLANT
GLOVE BIO SURGEON STRL SZ 6.5 (GLOVE) ×2 IMPLANT
GLOVE INDICATOR 6.5 STRL GRN (GLOVE) ×2 IMPLANT
GLOVE SKINSENSE NS SZ7.5 (GLOVE) ×1
GLOVE SKINSENSE NS SZ8.0 LF (GLOVE) ×1
GLOVE SKINSENSE STRL SZ7.5 (GLOVE) ×1 IMPLANT
GLOVE SKINSENSE STRL SZ8.0 LF (GLOVE) ×1 IMPLANT
GOWN PREVENTION PLUS LG XLONG (DISPOSABLE) ×4 IMPLANT
KIT ABG SYR 3ML LUER SLIP (SYRINGE) IMPLANT
NEEDLE HYPO 25X5/8 SAFETYGLIDE (NEEDLE) IMPLANT
NS IRRIG 1000ML POUR BTL (IV SOLUTION) ×2 IMPLANT
PACK C SECTION WH (CUSTOM PROCEDURE TRAY) ×2 IMPLANT
PAD OB MATERNITY 4.3X12.25 (PERSONAL CARE ITEMS) ×2 IMPLANT
RTRCTR C-SECT PINK 25CM LRG (MISCELLANEOUS) ×2 IMPLANT
SLEEVE SCD COMPRESS KNEE MED (MISCELLANEOUS) ×2 IMPLANT
STAPLER VISISTAT 35W (STAPLE) IMPLANT
STRIP CLOSURE SKIN 1/2X4 (GAUZE/BANDAGES/DRESSINGS) ×4 IMPLANT
SUT CHROMIC 1 CTX 36 (SUTURE) ×6 IMPLANT
SUT PLAIN 0 NONE (SUTURE) IMPLANT
SUT PLAIN 2 0 XLH (SUTURE) ×2 IMPLANT
SUT VIC AB 0 CT1 27 (SUTURE) ×2
SUT VIC AB 0 CT1 27XBRD ANBCTR (SUTURE) ×2 IMPLANT
SUT VIC AB 2-0 CT1 27 (SUTURE) ×1
SUT VIC AB 2-0 CT1 TAPERPNT 27 (SUTURE) ×1 IMPLANT
SUT VIC AB 4-0 KS 27 (SUTURE) ×2 IMPLANT
TOWEL OR 17X24 6PK STRL BLUE (TOWEL DISPOSABLE) ×6 IMPLANT
TRAY FOLEY CATH 14FR (SET/KITS/TRAYS/PACK) IMPLANT
WATER STERILE IRR 1000ML POUR (IV SOLUTION) IMPLANT

## 2012-08-24 NOTE — Progress Notes (Signed)
Patient ID: Kerry Richards, female   DOB: June 07, 1991, 21 y.o.   MRN: [id number] Pt with some back pain and rectal pressure.   FHR reactive Contractions adequate Cervix c/8-9/0  Trying to get her more comfortable and following progress.

## 2012-08-24 NOTE — Progress Notes (Signed)
Patient ID: Kerry Richards, female   DOB: 29-Dec-1990, 21 y.o.   MRN: [id number] Pt admitted for IOL and to be started on Pitocin FHR reactive Cervix 50/2+/-2 posterior AROM scant clear fluid Will follow progress.

## 2012-08-24 NOTE — Progress Notes (Signed)
Patient ID: Kerry Richards, female   DOB: 1991-03-28, 21 y.o.   MRN: [id number] Pt reached complete dilation around 940pm.  At that point she was having extreme perineal and rectal discomfort so pushing was attempted.  She pushed with good effort, but became so uncomfortable it Was hard for her to continue. Anesthesia was called and redosed her epidural with good result.  The patient was then able to continue pushing for another hour, good effort but no real descent of vertex beyond a +1  Station.  We discussed proceeding with a c-section and risks/benefits reviewed.  The risk of infection and damage to surrounding organs as well as bleeding reviewed.  Desires to proceed.  OR notified and preparing.

## 2012-08-24 NOTE — Anesthesia Procedure Notes (Signed)
Epidural Patient location during procedure: OB Start time: 08/24/2012 2:27 PM  Staffing Anesthesiologist: Brayton Caves R Performed by: anesthesiologist   Preanesthetic Checklist Completed: patient identified, site marked, surgical consent, pre-op evaluation, timeout performed, IV checked, risks and benefits discussed and monitors and equipment checked  Epidural Patient position: sitting Prep: site prepped and draped and DuraPrep Patient monitoring: continuous pulse ox and blood pressure Approach: midline Injection technique: LOR air and LOR saline  Needle:  Needle type: Tuohy  Needle gauge: 17 G Needle length: 9 cm and 9 Needle insertion depth: 8 cm Catheter type: closed end flexible Catheter size: 19 Gauge Catheter at skin depth: 14 cm Test dose: negative  Assessment Events: blood not aspirated, injection not painful, no injection resistance, negative IV test and no paresthesia  Additional Notes Patient identified.  Risk benefits discussed including failed block, incomplete pain control, headache, nerve damage, paralysis, blood pressure changes, nausea, vomiting, reactions to medication both toxic or allergic, and postpartum back pain.  Patient expressed understanding and wished to proceed.  All questions were answered.  Sterile technique used throughout procedure and epidural site dressed with sterile barrier dressing. No paresthesia or other complications noted.The patient did not experience any signs of intravascular injection such as tinnitus or metallic taste in mouth nor signs of intrathecal spread such as rapid motor block. Please see nursing notes for vital signs.

## 2012-08-24 NOTE — Anesthesia Preprocedure Evaluation (Addendum)
Anesthesia Evaluation  Patient identified by MRN, date of birth, ID band Patient awake    Reviewed: Allergy & Precautions, H&P , Patient's Chart, lab work & pertinent test results  Airway Mallampati: III TM Distance: >3 FB Neck ROM: full    Dental No notable dental hx. (+) Teeth Intact   Pulmonary neg pulmonary ROS,  breath sounds clear to auscultation  Pulmonary exam normal       Cardiovascular hypertension, negative cardio ROS  Rhythm:regular Rate:Normal     Neuro/Psych  Headaches, negative neurological ROS  negative psych ROS   GI/Hepatic negative GI ROS, Neg liver ROS,   Endo/Other  negative endocrine ROSMorbid obesity  Renal/GU negative Renal ROS     Musculoskeletal   Abdominal (+) + obese,   Peds  Hematology negative hematology ROS (+)   Anesthesia Other Findings Migraine     Hypertension        Anxiety     Trichomonas contact, treated        Hx of pyelonephritis    Reproductive/Obstetrics (+) Pregnancy                          Anesthesia Physical Anesthesia Plan  ASA: III and emergent  Anesthesia Plan: Epidural   Post-op Pain Management:    Induction:   Airway Management Planned:   Additional Equipment:   Intra-op Plan:   Post-operative Plan:   Informed Consent: I have reviewed the patients History and Physical, chart, labs and discussed the procedure including the risks, benefits and alternatives for the proposed anesthesia with the patient or authorized representative who has indicated his/her understanding and acceptance.     Plan Discussed with: CRNA, Anesthesiologist and Surgeon  Anesthesia Plan Comments:        Anesthesia Quick Evaluation

## 2012-08-24 NOTE — Progress Notes (Signed)
Patient ID: Kerry Richards, female   DOB: Jan 26, 1991, 21 y.o.   MRN: [id number] Pt comfortable with epidural,  FHR reactive.  Cervix with good change to 5/90/-1  IUPC placed per RN request b/c contractions not tracing well. Continue to follow progress.

## 2012-08-25 ENCOUNTER — Encounter (HOSPITAL_COMMUNITY): Payer: Self-pay

## 2012-08-25 LAB — CBC
HCT: 31.3 % — ABNORMAL LOW (ref 36.0–46.0)
MCH: 28.3 pg (ref 26.0–34.0)
MCV: 86 fL (ref 78.0–100.0)
RBC: 3.64 MIL/uL — ABNORMAL LOW (ref 3.87–5.11)
WBC: 16.7 10*3/uL — ABNORMAL HIGH (ref 4.0–10.5)

## 2012-08-25 MED ORDER — PRENATAL MULTIVITAMIN CH
1.0000 | ORAL_TABLET | Freq: Every day | ORAL | Status: DC
  Administered 2012-08-26 – 2012-08-28 (×3): 1 via ORAL
  Filled 2012-08-25 (×4): qty 1

## 2012-08-25 MED ORDER — ONDANSETRON HCL 4 MG/2ML IJ SOLN
4.0000 mg | INTRAMUSCULAR | Status: DC | PRN
  Administered 2012-08-25: 4 mg via INTRAVENOUS
  Filled 2012-08-25: qty 2

## 2012-08-25 MED ORDER — FENTANYL CITRATE 0.05 MG/ML IJ SOLN
INTRAMUSCULAR | Status: AC
  Filled 2012-08-25: qty 2

## 2012-08-25 MED ORDER — OXYCODONE-ACETAMINOPHEN 5-325 MG PO TABS
1.0000 | ORAL_TABLET | ORAL | Status: DC | PRN
  Administered 2012-08-25 (×2): 1 via ORAL
  Administered 2012-08-26 (×3): 2 via ORAL
  Administered 2012-08-26: 1 via ORAL
  Administered 2012-08-27 (×3): 2 via ORAL
  Administered 2012-08-27 – 2012-08-28 (×3): 1 via ORAL
  Filled 2012-08-25: qty 1
  Filled 2012-08-25: qty 2
  Filled 2012-08-25 (×4): qty 1
  Filled 2012-08-25: qty 2
  Filled 2012-08-25: qty 1
  Filled 2012-08-25 (×4): qty 2

## 2012-08-25 MED ORDER — KETOROLAC TROMETHAMINE 30 MG/ML IJ SOLN
INTRAMUSCULAR | Status: AC
  Administered 2012-08-25: 30 mg via INTRAMUSCULAR
  Filled 2012-08-25: qty 1

## 2012-08-25 MED ORDER — NALBUPHINE SYRINGE 5 MG/0.5 ML
5.0000 mg | INJECTION | INTRAMUSCULAR | Status: DC | PRN
  Administered 2012-08-25: 10 mg via SUBCUTANEOUS
  Filled 2012-08-25: qty 1

## 2012-08-25 MED ORDER — SCOPOLAMINE 1 MG/3DAYS TD PT72
MEDICATED_PATCH | TRANSDERMAL | Status: AC
  Administered 2012-08-25: 1.5 mg via TRANSDERMAL
  Filled 2012-08-25: qty 1

## 2012-08-25 MED ORDER — DIPHENHYDRAMINE HCL 50 MG/ML IJ SOLN: 12.5000 mg | INTRAMUSCULAR | Status: DC | PRN

## 2012-08-25 MED ORDER — FAMOTIDINE 20 MG PO TABS
40.0000 mg | ORAL_TABLET | Freq: Every day | ORAL | Status: DC
  Administered 2012-08-26 – 2012-08-27 (×2): 40 mg via ORAL
  Administered 2012-08-28: 20 mg via ORAL
  Filled 2012-08-25 (×2): qty 2
  Filled 2012-08-25: qty 1

## 2012-08-25 MED ORDER — TETANUS-DIPHTH-ACELL PERTUSSIS 5-2.5-18.5 LF-MCG/0.5 IM SUSP: 0.5000 mL | Freq: Once | INTRAMUSCULAR | Status: DC

## 2012-08-25 MED ORDER — LACTATED RINGERS IV SOLN: INTRAVENOUS | Status: DC

## 2012-08-25 MED ORDER — DIBUCAINE 1 % RE OINT: 1.0000 "application " | TOPICAL_OINTMENT | RECTAL | Status: DC | PRN

## 2012-08-25 MED ORDER — METOCLOPRAMIDE HCL 5 MG/ML IJ SOLN
10.0000 mg | Freq: Three times a day (TID) | INTRAMUSCULAR | Status: DC | PRN
  Administered 2012-08-25: 10 mg via INTRAVENOUS
  Filled 2012-08-25: qty 2

## 2012-08-25 MED ORDER — LANOLIN HYDROUS EX OINT: 1.0000 "application " | TOPICAL_OINTMENT | CUTANEOUS | Status: DC | PRN

## 2012-08-25 MED ORDER — OXYTOCIN 10 UNIT/ML IJ SOLN
40.0000 [IU] | INTRAVENOUS | Status: DC | PRN
  Administered 2012-08-25: 40 [IU] via INTRAVENOUS

## 2012-08-25 MED ORDER — METHYLDOPA 250 MG PO TABS
250.0000 mg | ORAL_TABLET | Freq: Every day | ORAL | Status: DC
  Administered 2012-08-25 – 2012-08-28 (×4): 250 mg via ORAL
  Filled 2012-08-25 (×4): qty 1

## 2012-08-25 MED ORDER — WITCH HAZEL-GLYCERIN EX PADS
1.0000 | MEDICATED_PAD | CUTANEOUS | Status: DC | PRN
Start: 2012-08-25 — End: 2012-08-28

## 2012-08-25 MED ORDER — SIMETHICONE 80 MG PO CHEW
80.0000 mg | CHEWABLE_TABLET | Freq: Three times a day (TID) | ORAL | Status: DC
  Administered 2012-08-25 – 2012-08-28 (×13): 80 mg via ORAL

## 2012-08-25 MED ORDER — NALOXONE HCL 1 MG/ML IJ SOLN
1.0000 ug/kg/h | INTRAMUSCULAR | Status: DC | PRN
  Filled 2012-08-25: qty 2

## 2012-08-25 MED ORDER — ONDANSETRON HCL 4 MG/2ML IJ SOLN: 4.0000 mg | Freq: Three times a day (TID) | INTRAMUSCULAR | Status: DC | PRN

## 2012-08-25 MED ORDER — SENNOSIDES-DOCUSATE SODIUM 8.6-50 MG PO TABS
2.0000 | ORAL_TABLET | Freq: Every day | ORAL | Status: DC
  Administered 2012-08-25 – 2012-08-27 (×3): 2 via ORAL

## 2012-08-25 MED ORDER — DIPHENHYDRAMINE HCL 50 MG/ML IJ SOLN: 25.0000 mg | INTRAMUSCULAR | Status: DC | PRN

## 2012-08-25 MED ORDER — FENTANYL CITRATE 0.05 MG/ML IJ SOLN
INTRAMUSCULAR | Status: DC | PRN
  Administered 2012-08-25 (×2): 50 ug via INTRAVENOUS

## 2012-08-25 MED ORDER — NALBUPHINE SYRINGE 5 MG/0.5 ML
5.0000 mg | INJECTION | INTRAMUSCULAR | Status: DC | PRN
  Filled 2012-08-25 (×2): qty 1

## 2012-08-25 MED ORDER — MORPHINE SULFATE (PF) 0.5 MG/ML IJ SOLN
INTRAMUSCULAR | Status: DC | PRN
  Administered 2012-08-25: 1 mg via INTRAVENOUS

## 2012-08-25 MED ORDER — DIPHENHYDRAMINE HCL 25 MG PO CAPS: 25.0000 mg | ORAL_CAPSULE | ORAL | Status: DC | PRN

## 2012-08-25 MED ORDER — MENTHOL 3 MG MT LOZG: 1.0000 | LOZENGE | OROMUCOSAL | Status: DC | PRN

## 2012-08-25 MED ORDER — DIPHENHYDRAMINE HCL 25 MG PO CAPS: 25.0000 mg | ORAL_CAPSULE | Freq: Four times a day (QID) | ORAL | Status: DC | PRN

## 2012-08-25 MED ORDER — LACTATED RINGERS IV SOLN
INTRAVENOUS | Status: DC | PRN
  Administered 2012-08-25: via INTRAVENOUS

## 2012-08-25 MED ORDER — IBUPROFEN 600 MG PO TABS
600.0000 mg | ORAL_TABLET | Freq: Four times a day (QID) | ORAL | Status: DC
  Administered 2012-08-25 – 2012-08-28 (×12): 600 mg via ORAL
  Filled 2012-08-25 (×12): qty 1

## 2012-08-25 MED ORDER — NALOXONE HCL 0.4 MG/ML IJ SOLN: 0.4000 mg | INTRAMUSCULAR | Status: DC | PRN

## 2012-08-25 MED ORDER — SIMETHICONE 80 MG PO CHEW: 80.0000 mg | CHEWABLE_TABLET | ORAL | Status: DC | PRN

## 2012-08-25 MED ORDER — IBUPROFEN 600 MG PO TABS: 600.0000 mg | ORAL_TABLET | Freq: Four times a day (QID) | ORAL | Status: DC | PRN

## 2012-08-25 MED ORDER — ONDANSETRON HCL 4 MG PO TABS: 4.0000 mg | ORAL_TABLET | ORAL | Status: DC | PRN

## 2012-08-25 MED ORDER — MORPHINE SULFATE (PF) 0.5 MG/ML IJ SOLN
INTRAMUSCULAR | Status: DC | PRN
  Administered 2012-08-25: 4 mg via EPIDURAL

## 2012-08-25 MED ORDER — SODIUM CHLORIDE 0.9 % IJ SOLN: 3.0000 mL | INTRAMUSCULAR | Status: DC | PRN

## 2012-08-25 MED ORDER — MEPERIDINE HCL 25 MG/ML IJ SOLN
INTRAMUSCULAR | Status: DC | PRN
  Administered 2012-08-25 (×3): 4 mg via INTRAVENOUS

## 2012-08-25 MED ORDER — ZOLPIDEM TARTRATE 5 MG PO TABS
5.0000 mg | ORAL_TABLET | Freq: Every evening | ORAL | Status: DC | PRN
  Filled 2012-08-25: qty 1

## 2012-08-25 MED ORDER — ONDANSETRON HCL 4 MG/2ML IJ SOLN
INTRAMUSCULAR | Status: DC | PRN
  Administered 2012-08-25: 4 mg via INTRAVENOUS

## 2012-08-25 MED ORDER — OXYTOCIN 40 UNITS IN LACTATED RINGERS INFUSION - SIMPLE MED: 62.5000 mL/h | INTRAVENOUS | Status: AC

## 2012-08-25 NOTE — Progress Notes (Signed)
Subjective: Postpartum Day 0 Cesarean Delivery Patient reports tolerating PO.    Objective: Vital signs in last 24 hours: Temp:  [98.2 F (36.8 C)-99.3 F (37.4 C)] 98.3 F (36.8 C) (12/13 0500) Pulse Rate:  [79-132] 107  (12/13 0500) Resp:  [16-23] 18  (12/13 0500) BP: (103-162)/(53-107) 145/88 mmHg (12/13 0500) SpO2:  [94 %-100 %] 94 % (12/13 0500) Weight:  [112.492 kg (248 lb)] 112.492 kg (248 lb) (12/12 0727)  Physical Exam:  General: alert and cooperative Lochia: appropriate Uterine Fundus: firm Incision: C/D/I    Basename 08/24/12 1346 08/24/12 0800  HGB 12.1 12.1  HCT 36.1 36.4    Assessment/Plan: Status post Cesarean section. Doing well postoperatively.  Continue current care. Pt is interested in circumcision, plans to pay today and has already paid our office.  Risks and benefits reviewed.  Quiros Pila 08/25/2012, 6:30 AM

## 2012-08-25 NOTE — Op Note (Signed)
Operative note  Preoperative diagnosis Term pregnancy at 40-2/[redacted] weeks gestation Chronic hypertension Arrest of descent  Postoperative diagnosis Same  Procedure Primary low transverse C-section with 2 layer closure of uterus  Surgeon Dr. Huel Cote  Anesthesia Epidural  Fluids Estimated blood loss 800 cc Urine output 100 cc clear urine IV fluid 2200 cc LR  Findings A viable female infant in the vertex presentation. Apgars 8 and 9 weight pending at time of dictation. Patient had normal uterus tubes and ovaries.  Specimen Placenta sent to L&D  Procedure note After informed consent was obtained patient was taken to the operating room where epidural anesthesia was found to be adequate by Allis clamp test. She was then prepped and draped in the normal sterile fashion in the dorsal supine position with a leftward tilt. An appropriate time out was performed. A Pfannenstiel skin incision was then made with the scalpel and carried through to the underlying layer of fascia by sharp dissection and Bovie cautery. Fascia was nicked in the midline and the incision extended laterally with Mayo scissors. The inferior aspect of the incision was grasped with Coker clamps elevated and dissected off the underlying rectus muscles. In a similar fashion the superior aspect was dissected off the rectus muscles. Rectus muscles were separated in the midline and the peritoneal cavity entered bluntly. Peritoneal incision was then extended both superiorly and inferiorly with careful attention to avoid both bowel bladder. The Alexis self-retaining wound retractor was then placed within the incision and the lower uterine segment exposed. The lower uterine segment was then incised in a transverse fashion and the cavity entered bluntly. The infant's head was then elevated through the incision and delivered without difficulty the nose and mouth bulb suctioned and the remainder of the body delivered easily. The cord  was clamped and cut and infant handed to waiting pediatricians. The placenta was then expressed spontaneously after cord blood was obtained. The uterus was cleared of all clots and debris with moist lap sponge. The uterine incision was then closed in 2 layers the first a running locked layer 1-0 chromic and the second an imbricating layer of the same suture. One additional figure-of-eight suture was placed at the midline for hemostasis. At this point the gutters were cleared of all clots and debris the tubes and ovaries inspected and found to be normal the incision was hemostatic therefore the Alexis retractor was removed from the abdomen as well as all instruments and sponges. The peritoneum and rectus muscles were then reapproximated with several interrupted mattress sutures of 2-0 Vicryl. The fascia was closed with 0 Vicryl in a running fashion. The subcutaneous tissue was closed with 20 plain in running fashion. The skin was closed with 3-0 Vicryl on a Keith needle in a subcuticular stitch and reinforced with benzoin and Steri-Strips. Sponge and instrument counts were again correct and the patient was taken to the recovery room in good condition with the baby accompanying her.

## 2012-08-25 NOTE — Progress Notes (Signed)
Patient was referred for history of depression/anxiety.  * Referral screened out by Clinical Social Worker because none of the following criteria appear to apply:  ~ History of anxiety/depression during this pregnancy, or of post-partum depression.  ~ Diagnosis of anxiety and/or depression within last 3 years  ~ History of depression due to pregnancy loss/loss of child  OR  * Patient's symptoms currently being treated with medication and/or therapy.  Please contact the Clinical Social Worker if needs arise, or by the patient's request.  As per chart review, pt anxiety symptoms are situational & related to doctors visits/blood draws.       

## 2012-08-25 NOTE — Brief Op Note (Signed)
08/24/2012 - 08/25/2012  12:51 AM  PATIENT:  Kerry Richards  21 y.o. female  PRE-OPERATIVE DIAGNOSIS:  Failure to Descend  POST-OPERATIVE DIAGNOSIS:  Failure to Descend  PROCEDURE:  Procedure(s) (LRB) with comments: CESAREAN SECTION (N/A) - Primary Cesarean Section Delivery Baby  Boy @ 0004, Apgars 9/9  SURGEON:  Surgeon(s) and Role:    * Falco Pila, MD - Primary  ANESTHESIA:   epidural  EBL:  Total I/O In: 1600 [I.V.:1600] Out: 1100 [Urine:400; Blood:700]  BLOOD ADMINISTERED:none  DRAINS: Urinary Catheter (Foley)   LOCAL MEDICATIONS USED:  NONE  SPECIMEN: placenta  DISPOSITION OF SPECIMEN:  L&D COUNTS:  YES  TOURNIQUET:  * No tourniquets in log *  DICTATION: .Dragon Dictation  PLAN OF CARE: Admit to inpatient   PATIENT DISPOSITION:  PACU - hemodynamically stable.

## 2012-08-25 NOTE — Addendum Note (Signed)
Addendum  created 08/25/12 1018 by Jhonnie Garner, CRNA   Modules edited:Notes Section

## 2012-08-25 NOTE — Anesthesia Postprocedure Evaluation (Signed)
Anesthesia Post Note  Patient: Kerry Richards  Procedure(s) Performed: Procedure(s) (LRB): CESAREAN SECTION (N/A)  Anesthesia type: Epidural  Patient location: Mother/Baby  Post pain: Pain level controlled  Post assessment: Post-op Vital signs reviewed  Last Vitals:  Filed Vitals:   08/25/12 0800  BP: 133/80  Pulse: 96  Temp: 36.8 C  Resp: 18    Post vital signs: Reviewed  Level of consciousness: awake  Complications: No apparent anesthesia complications

## 2012-08-25 NOTE — Transfer of Care (Signed)
Immediate Anesthesia Transfer of Care Note  Patient: Kerry Richards  Procedure(s) Performed: Procedure(s) (LRB) with comments: CESAREAN SECTION (N/A) - Primary Cesarean Section Delivery Baby  Boy @ [phone], Apgars 9/9  Patient Location: PACU  Anesthesia Type:Epidural  Level of Consciousness: awake, alert , oriented and patient cooperative  Airway & Oxygen Therapy: Patient Spontanous Breathing  Post-op Assessment: Report given to PACU RN and Post -op Vital signs reviewed and stable  Post vital signs: Reviewed and stable  Complications: No apparent anesthesia complications

## 2012-08-25 NOTE — Progress Notes (Signed)
UR chart review completed.  

## 2012-08-25 NOTE — Anesthesia Postprocedure Evaluation (Signed)
  Anesthesia Post-op Note  Patient: Kerry Richards  Procedure(s) Performed: Procedure(s) (LRB) with comments: CESAREAN SECTION (N/A) - Primary Cesarean Section Delivery Baby  Boy @ [phone], Apgars 9/9  Patient Location: PACU  Anesthesia Type:Epidural  Level of Consciousness: awake, alert  and oriented  Airway and Oxygen Therapy: Patient Spontanous Breathing  Post-op Pain: none  Post-op Assessment: Post-op Vital signs reviewed, Patient's Cardiovascular Status Stable, Respiratory Function Stable, Patent Airway, No signs of Nausea or vomiting, Pain level controlled, No headache, No backache, No residual numbness and No residual motor weakness  Post-op Vital Signs: Reviewed and stable  Complications: No apparent anesthesia complications

## 2012-08-26 NOTE — Progress Notes (Signed)
Subjective: Postpartum Day #1: Cesarean Delivery Patient reports incisional pain, tolerating PO and no problems voiding.    Objective: Vital signs in last 24 hours: Temp:  [97.7 F (36.5 C)-99.2 F (37.3 C)] 97.7 F (36.5 C) (12/14 4098) Pulse Rate:  [78-101] 78  (12/14 0613) Resp:  [18] 18  (12/14 0613) BP: (123-129)/(65-85) 123/71 mmHg (12/14 0613) SpO2:  [97 %-98 %] 98 % (12/13 2320)  Physical Exam:  General: alert Lochia: appropriate Uterine Fundus: firm Incision: healing well   Basename 08/25/12 0600 08/24/12 1346  HGB 10.3* 12.1  HCT 31.3* 36.1    Assessment/Plan: Status post Cesarean section. Doing well postoperatively.  Continue current care, ambulate.  Armondo Cech D 08/26/2012, 9:58 AM

## 2012-08-27 NOTE — Progress Notes (Signed)
POD #2 Doing ok, sore, working on breastfeeding Afeb, VSS Abd- soft, fundus firm, incision intact Continue routine care

## 2012-08-28 MED ORDER — IBUPROFEN 600 MG PO TABS: 600.0000 mg | ORAL_TABLET | Freq: Four times a day (QID) | ORAL | Status: DC

## 2012-08-28 MED ORDER — OXYCODONE-ACETAMINOPHEN 5-325 MG PO TABS: 1.0000 | ORAL_TABLET | ORAL | Status: DC | PRN

## 2012-08-28 NOTE — Discharge Summary (Signed)
Obstetric Discharge Summary Reason for Admission: induction of labor Prenatal Procedures: NST Intrapartum Procedures: cesarean: low cervical, transverse Postpartum Procedures: none Complications-Operative and Postpartum: none Hemoglobin  Date Value Range Status  08/25/2012 10.3* 12.0 - 15.0 g/dL Final     HCT  Date Value Range Status  08/25/2012 31.3* 36.0 - 46.0 % Final    Physical Exam:  General: alert and cooperative Lochia: appropriate Uterine Fundus: firm Incision: healing well, no significant drainage, no significant erythema  Discharge Diagnoses: Term Pregnancy-delivered                                         Chronic Hypertension Discharge Information: Date: 08/28/2012 Activity: pelvic rest Diet: routine Medications: Ibuprofen and Percocet Condition: improved Instructions: refer to practice specific booklet Discharge to: home Follow-up Information    Follow up with Lejeune Pila, MD. Schedule an appointment as soon as possible for a visit in 2 weeks. (incision check)    Contact information:   [address], [address] [place] [address] [phone]          Newborn Data: Live born female  Birth Weight: 8 lb 2.9 oz (3710 g) APGAR: 9, 9  Home with mother.  Fielden Pila 08/28/2012, 9:32 AM

## 2012-08-28 NOTE — Progress Notes (Signed)
Subjective: Postpartum Day 3 Cesarean Delivery Patient reports tolerating PO and no problems voiding.    Objective: Vital signs in last 24 hours: Temp:  [97.7 F (36.5 C)-98.4 F (36.9 C)] 97.7 F (36.5 C) (12/16 0542) Pulse Rate:  [74-93] 78  (12/16 0542) Resp:  [18-20] 20  (12/16 0542) BP: (119-131)/(78-82) 119/78 mmHg (12/16 0542)  Physical Exam:  General: alert and cooperative Lochia: appropriate Uterine Fundus: firm Incision: healing well, no significant erythema   Assessment/Plan: Status post Cesarean section. Doing well postoperatively.  Discharge home with standard precautions and return to office in 2 weeks for incision check.Marland Kitchen  Kerry Richards 08/28/2012, 9:20 AM

## 2013-09-05 ENCOUNTER — Encounter (HOSPITAL_COMMUNITY): Payer: Self-pay | Admitting: Emergency Medicine

## 2013-09-05 ENCOUNTER — Emergency Department (HOSPITAL_COMMUNITY)
Admission: EM | Admit: 2013-09-05 | Discharge: 2013-09-05 | Discharge status: Against Medical Advice | Payer: Medicaid Other | Attending: Emergency Medicine | Admitting: Emergency Medicine

## 2013-09-05 DIAGNOSIS — R51 Headache: Secondary | ICD-10-CM | POA: Insufficient documentation

## 2013-09-05 DIAGNOSIS — R509 Fever, unspecified: Secondary | ICD-10-CM | POA: Insufficient documentation

## 2013-09-05 DIAGNOSIS — J029 Acute pharyngitis, unspecified: Secondary | ICD-10-CM

## 2013-09-05 DIAGNOSIS — I10 Essential (primary) hypertension: Secondary | ICD-10-CM | POA: Insufficient documentation

## 2013-09-05 DIAGNOSIS — R109 Unspecified abdominal pain: Secondary | ICD-10-CM

## 2013-09-05 LAB — POCT PREGNANCY, URINE: Preg Test, Ur: NEGATIVE

## 2013-09-05 LAB — CBC WITH DIFFERENTIAL/PLATELET
Basophils Relative: 0 % (ref 0–1)
Eosinophils Absolute: 0.1 10*3/uL (ref 0.0–0.7)
Eosinophils Relative: 1 % (ref 0–5)
MCH: 30.7 pg (ref 26.0–34.0)
MCHC: 34.8 g/dL (ref 30.0–36.0)
MCV: 88.2 fL (ref 78.0–100.0)
Neutrophils Relative %: 69 % (ref 43–77)
Platelets: 238 10*3/uL (ref 150–400)
RDW: 12.9 % (ref 11.5–15.5)

## 2013-09-05 LAB — COMPREHENSIVE METABOLIC PANEL
ALT: 13 U/L (ref 0–35)
Albumin: 3.6 g/dL (ref 3.5–5.2)
Alkaline Phosphatase: 79 U/L (ref 39–117)
Calcium: 8.8 mg/dL (ref 8.4–10.5)
GFR calc Af Amer: >90 mL/min (ref >90)
Potassium: 4.1 mEq/L (ref 3.5–5.1)
Sodium: 134 mEq/L — ABNORMAL LOW (ref 135–145)
Total Protein: 8 g/dL (ref 6.0–8.3)

## 2013-09-05 LAB — URINALYSIS, ROUTINE W REFLEX MICROSCOPIC
Bilirubin Urine: NEGATIVE
Hgb urine dipstick: NEGATIVE
Nitrite: NEGATIVE
Specific Gravity, Urine: 1.028 (ref 1.005–1.030)
Urobilinogen, UA: 0.2 mg/dL (ref 0.0–1.0)
pH: 6.5 (ref 5.0–8.0)

## 2013-09-05 LAB — LIPASE, BLOOD: Lipase: 73 U/L — ABNORMAL HIGH (ref 11–59)

## 2013-09-05 NOTE — ED Provider Notes (Signed)
CSN: [id number]     Arrival date & time 09/05/13  1815 History   First MD Initiated Contact with Patient 09/05/13 2300     Chief Complaint  Patient presents with  . Abdominal Pain  . Fever  . Headache   (Consider location/radiation/quality/duration/timing/severity/associated sxs/prior Treatment) HPI  Past Medical History  Diagnosis Date  . Migraine   . Hypertension   . Anxiety   . Trichomonas contact, treated   . Hx of pyelonephritis    Past Surgical History  Procedure Laterality Date  . Tooth extraction    . Wisdom tooth extraction    . Dilation and curettage of uterus    . Cesarean section  08/24/2012    Procedure: CESAREAN SECTION;  Surgeon: Mclear Pila, MD;  Location: [facility];  Service: Obstetrics;  Laterality: N/A;  Primary Cesarean Section Delivery Baby  Boy @ 0004, Apgars 9/9   Family History  Problem Relation Age of Onset  . Anesthesia problems Neg Hx   . Other Neg Hx   . Hypertension Father   . Migraines Father   . Stroke Father   . Arthritis Maternal Aunt   . Arthritis Maternal Grandmother   . Cancer Maternal Grandmother     lung  . Diabetes Paternal Grandfather   . Heart disease Paternal Grandfather   . Hypertension Paternal Grandfather   . Heart disease Paternal Aunt    History  Substance Use Topics  . Smoking status: Former Smoker -- 0.50 packs/day    Types: Cigarettes    Quit date: 09/30/2011  . Smokeless tobacco: Never Used  . Alcohol Use: No   OB History   Grav Para Term Preterm Abortions TAB SAB Ect Mult Living   3 1 1  0 2 1 1   1      Review of Systems  Allergies  Shellfish allergy; Amitriptyline; and Metronidazole  Home Medications   Current Outpatient Rx  Name  Route  Sig  Dispense  Refill  . azithromycin (ZITHROMAX) 250 MG tablet   Oral   Take 500 mg by mouth once.         . Homeopathic Products (SIMILASAN PINK EYE RELIEF) SOLN   Both Eyes   Place 1 drop into both eyes 3 (three) times daily.         Marland Kitchen ibuprofen  (ADVIL,MOTRIN) 600 MG tablet   Oral   Take 600 mg by mouth every 4 (four) hours as needed for mild pain.         Marland Kitchen Phenylephrine-DM-GG-APAP (TYLENOL COLD/FLU SEVERE) 5-10-200-325 MG TABS   Oral   Take 2 capsules by mouth every 6 (six) hours as needed (for cold symptoms).          BP 137/84  Pulse 105  Temp(Src) 98.8 F (37.1 C) (Oral)  Resp 18  SpO2 98%  LMP 08/08/2013 Physical Exam  ED Course  Procedures (including critical care time) Labs Review Labs Reviewed  CBC WITH DIFFERENTIAL - Abnormal; Notable for the following:    Monocytes Relative 15 (*)    All other components within normal limits  COMPREHENSIVE METABOLIC PANEL - Abnormal; Notable for the following:    Sodium 134 (*)    Total Bilirubin 0.2 (*)    All other components within normal limits  LIPASE, BLOOD - Abnormal; Notable for the following:    Lipase 73 (*)    All other components within normal limits  URINALYSIS, ROUTINE W REFLEX MICROSCOPIC  POCT PREGNANCY, URINE   Results for orders placed  during the hospital encounter of 09/05/13  CBC WITH DIFFERENTIAL      Result Value Range   WBC 5.8  4.0 - 10.5 K/uL   RBC 4.73  3.87 - 5.11 MIL/uL   Hemoglobin 14.5  12.0 - 15.0 g/dL   HCT 16.1  09.6 - 04.5 %   MCV 88.2  78.0 - 100.0 fL   MCH 30.7  26.0 - 34.0 pg   MCHC 34.8  30.0 - 36.0 g/dL   RDW 40.9  81.1 - 91.4 %   Platelets 238  150 - 400 K/uL   Neutrophils Relative % 69  43 - 77 %   Neutro Abs 4.0  1.7 - 7.7 K/uL   Lymphocytes Relative 15  12 - 46 %   Lymphs Abs 0.9  0.7 - 4.0 K/uL   Monocytes Relative 15 (*) 3 - 12 %   Monocytes Absolute 0.9  0.1 - 1.0 K/uL   Eosinophils Relative 1  0 - 5 %   Eosinophils Absolute 0.1  0.0 - 0.7 K/uL   Basophils Relative 0  0 - 1 %   Basophils Absolute 0.0  0.0 - 0.1 K/uL  COMPREHENSIVE METABOLIC PANEL      Result Value Range   Sodium 134 (*) 135 - 145 mEq/L   Potassium 4.1  3.5 - 5.1 mEq/L   Chloride 99  96 - 112 mEq/L   CO2 23  19 - 32 mEq/L   Glucose, Bld  92  70 - 99 mg/dL   BUN 12  6 - 23 mg/dL   Creatinine, Ser 7.82  0.50 - 1.10 mg/dL   Calcium 8.8  8.4 - 95.6 mg/dL   Total Protein 8.0  6.0 - 8.3 g/dL   Albumin 3.6  3.5 - 5.2 g/dL   AST 15  0 - 37 U/L   ALT 13  0 - 35 U/L   Alkaline Phosphatase 79  39 - 117 U/L   Total Bilirubin 0.2 (*) 0.3 - 1.2 mg/dL   GFR calc non Af Amer >90  >90 mL/min   GFR calc Af Amer >90  >90 mL/min  LIPASE, BLOOD      Result Value Range   Lipase 73 (*) 11 - 59 U/L  URINALYSIS, ROUTINE W REFLEX MICROSCOPIC      Result Value Range   Color, Urine YELLOW  YELLOW   APPearance CLEAR  CLEAR   Specific Gravity, Urine 1.028  1.005 - 1.030   pH 6.5  5.0 - 8.0   Glucose, UA NEGATIVE  NEGATIVE mg/dL   Hgb urine dipstick NEGATIVE  NEGATIVE   Bilirubin Urine NEGATIVE  NEGATIVE   Ketones, ur NEGATIVE  NEGATIVE mg/dL   Protein, ur NEGATIVE  NEGATIVE mg/dL   Urobilinogen, UA 0.2  0.0 - 1.0 mg/dL   Nitrite NEGATIVE  NEGATIVE   Leukocytes, UA NEGATIVE  NEGATIVE  POCT PREGNANCY, URINE      Result Value Range   Preg Test, Ur NEGATIVE  NEGATIVE    Imaging Review No results found.  EKG Interpretation   None       MDM   1. Sorethroat   2. Abdominal pain    Patient left without being seen before she was asked to seen by me. Her labs have been reviewed by me. Full note and cannot be completed.    Shelda Jakes, MD 09/05/13 [phone]

## 2013-09-05 NOTE — ED Notes (Signed)
Pt tearful at triage, multiple complaints. Reports pink eye x 5 days. Having stabbing abd pain, headache, back pain, throat pain, intermittent fever and n/v.

## 2013-09-05 NOTE — ED Notes (Addendum)
Patient seen leaving treatment room explaining that she had to get home to her sick child. Spoke with MDs to see if they could see patient prior to her leaving, but they explained that they could not see her right now. Advised that new MD will be here in 5 minutes and patient should be able to be see soon after that. Patient unable to wait any longer. Encouraged patient to return if symptoms persist, apologies made for wait. Patient understanding.

## 2014-04-11 ENCOUNTER — Inpatient Hospital Stay (HOSPITAL_COMMUNITY)
Admission: AD | Admit: 2014-04-11 | Discharge: 2014-04-11 | Disposition: A | Discharge status: Home / Self Care | Payer: Medicaid Other | Source: Ambulatory Visit | Attending: Obstetrics and Gynecology | Admitting: Obstetrics and Gynecology

## 2014-04-11 ENCOUNTER — Encounter (HOSPITAL_COMMUNITY): Payer: Self-pay

## 2014-04-11 DIAGNOSIS — M545 Low back pain, unspecified: Secondary | ICD-10-CM | POA: Insufficient documentation

## 2014-04-11 DIAGNOSIS — R109 Unspecified abdominal pain: Secondary | ICD-10-CM | POA: Insufficient documentation

## 2014-04-11 DIAGNOSIS — B9689 Other specified bacterial agents as the cause of diseases classified elsewhere: Secondary | ICD-10-CM | POA: Insufficient documentation

## 2014-04-11 DIAGNOSIS — F17200 Nicotine dependence, unspecified, uncomplicated: Secondary | ICD-10-CM | POA: Insufficient documentation

## 2014-04-11 DIAGNOSIS — I10 Essential (primary) hypertension: Secondary | ICD-10-CM | POA: Insufficient documentation

## 2014-04-11 DIAGNOSIS — A499 Bacterial infection, unspecified: Secondary | ICD-10-CM

## 2014-04-11 DIAGNOSIS — N760 Acute vaginitis: Secondary | ICD-10-CM | POA: Insufficient documentation

## 2014-04-11 LAB — URINALYSIS, ROUTINE W REFLEX MICROSCOPIC
BILIRUBIN URINE: NEGATIVE
Glucose, UA: NEGATIVE mg/dL
HGB URINE DIPSTICK: NEGATIVE
Ketones, ur: 15 mg/dL — AB
Leukocytes, UA: NEGATIVE
Nitrite: NEGATIVE
PH: 7 (ref 5.0–8.0)
Protein, ur: NEGATIVE mg/dL
SPECIFIC GRAVITY, URINE: 1.02 (ref 1.005–1.030)
UROBILINOGEN UA: 0.2 mg/dL (ref 0.0–1.0)

## 2014-04-11 LAB — WET PREP, GENITAL
TRICH WET PREP: NONE SEEN
YEAST WET PREP: NONE SEEN

## 2014-04-11 LAB — POCT PREGNANCY, URINE: Preg Test, Ur: NEGATIVE

## 2014-04-11 MED ORDER — CLINDAMYCIN HCL 300 MG PO CAPS: 300.0000 mg | ORAL_CAPSULE | Freq: Two times a day (BID) | ORAL | Status: DC

## 2014-04-11 NOTE — MAU Note (Addendum)
Pt states that since July 6th, she feels like she has an infection. Treated with monistat and that helped with the discharge. Now feels like the pain has moved into her abdomen and back. Denies vaginal bleeding. LMP: July 20th. Pt states burning with urination as well.

## 2014-04-11 NOTE — MAU Provider Note (Signed)
History     CSN: [id number]  Arrival date and time: 04/11/14 [id number]   First Provider Initiated Contact with Patient 04/11/14 2007      Chief Complaint  Patient presents with  . Abdominal Pain  . Back Pain  . Nausea   HPI Ms. Kerry Richards is a 23 y.o. [id number] who presents to MAU today with complaint of lower abdominal cramping and low back pain since 03/18/14. The patient also endorses dysuria and increased urinary frequency. She denies urgency, fever or vaginal bleeding. She states small amount of thin, white discharge. She rates her pain now at 6/10. She states recently split from long term partner and has had 2 new sexual partners since February.   OB History   Grav Para Term Preterm Abortions TAB SAB Ect Mult Living   3 1 1  0 2 1 1   1       Past Medical History  Diagnosis Date  . Migraine   . Hypertension   . Anxiety   . Trichomonas contact, treated   . Hx of pyelonephritis     Past Surgical History  Procedure Laterality Date  . Tooth extraction    . Wisdom tooth extraction    . Dilation and curettage of uterus    . Cesarean section  08/24/2012    Procedure: CESAREAN SECTION;  Surgeon: Neff PilaKathy W Richardson, MD;  Location: [facility];  Service: Obstetrics;  Laterality: N/A;  Primary Cesarean Section Delivery Baby  Boy @ 0004, Apgars 9/9    Family History  Problem Relation Age of Onset  . Anesthesia problems Neg Hx   . Other Neg Hx   . Hypertension Father   . Migraines Father   . Stroke Father   . Arthritis Maternal Aunt   . Arthritis Maternal Grandmother   . Cancer Maternal Grandmother     lung  . Diabetes Paternal Grandfather   . Heart disease Paternal Grandfather   . Hypertension Paternal Grandfather   . Heart disease Paternal Aunt     History  Substance Use Topics  . Smoking status: Current Every Day Smoker -- 0.50 packs/day    Types: Cigarettes    Last Attempt to Quit: 09/30/2011  . Smokeless tobacco: Never Used  . Alcohol Use: 0.0 oz/week     Allergies:  Allergies  Allergen Reactions  . Shellfish Allergy Anaphylaxis  . Amitriptyline     Nightmares   . Metronidazole Hives    Prescriptions prior to admission  Medication Sig Dispense Refill  . ibuprofen (ADVIL,MOTRIN) 200 MG tablet Take 800 mg by mouth every 6 (six) hours as needed for moderate pain.        Review of Systems  Constitutional: Negative for fever and malaise/fatigue.  Gastrointestinal: Positive for nausea and abdominal pain. Negative for vomiting.  Genitourinary: Positive for dysuria and frequency. Negative for urgency, hematuria and flank pain.       + vaginal discharge Neg - vaginal bleeding   Physical Exam   Blood pressure 133/77, pulse 78, temperature 99 F (37.2 C), temperature source Oral, resp. rate 18, height 5\' 3"  (1.6 m), weight 201 lb 9.6 oz (91.445 kg), last menstrual period 04/01/2014, SpO2 100.00%.  Physical Exam  Constitutional: She is oriented to person, place, and time. She appears well-developed and well-nourished. No distress.  HENT:  Head: Normocephalic.  Cardiovascular: Normal rate.   Respiratory: Effort normal.  GI: Soft. She exhibits no distension and no mass. There is tenderness (mild tenderness to palpation of the lower abdomen bilaterally).  There is no rebound and no guarding.  Genitourinary: Uterus is tender (mild). Uterus is not enlarged. Cervix exhibits no motion tenderness, no discharge and no friability. Right adnexum displays no mass and no tenderness. Left adnexum displays no mass and no tenderness. No bleeding around the vagina. Vaginal discharge (small amount of thin, white discharge noted) found.  Neurological: She is alert and oriented to person, place, and time.  Skin: Skin is warm and dry. No erythema.  Psychiatric: She has a normal mood and affect.   Results for orders placed during the hospital encounter of 04/11/14 (from the past 24 hour(s))  URINALYSIS, ROUTINE W REFLEX MICROSCOPIC     Status: Abnormal    Collection Time    04/11/14  7:52 PM      Result Value Ref Range   Color, Urine YELLOW  YELLOW   APPearance HAZY (*) CLEAR   Specific Gravity, Urine 1.020  1.005 - 1.030   pH 7.0  5.0 - 8.0   Glucose, UA NEGATIVE  NEGATIVE mg/dL   Hgb urine dipstick NEGATIVE  NEGATIVE   Bilirubin Urine NEGATIVE  NEGATIVE   Ketones, ur 15 (*) NEGATIVE mg/dL   Protein, ur NEGATIVE  NEGATIVE mg/dL   Urobilinogen, UA 0.2  0.0 - 1.0 mg/dL   Nitrite NEGATIVE  NEGATIVE   Leukocytes, UA NEGATIVE  NEGATIVE  POCT PREGNANCY, URINE     Status: None   Collection Time    04/11/14  8:08 PM      Result Value Ref Range   Preg Test, Ur NEGATIVE  NEGATIVE  WET PREP, GENITAL     Status: Abnormal   Collection Time    04/11/14  8:14 PM      Result Value Ref Range   Yeast Wet Prep HPF POC NONE SEEN  NONE SEEN   Trich, Wet Prep NONE SEEN  NONE SEEN   Clue Cells Wet Prep HPF POC FEW (*) NONE SEEN   WBC, Wet Prep HPF POC FEW (*) NONE SEEN     MAU Course  Procedures None  MDM UPT - negative UA, wet prep, GC/Chlamydia today Patient declines HIV testing  Assessment and Plan  A: Bacterial vaginosis  P: Discharge home Rx for Clindamycin sent to patient's pharmacy Discussed hygiene products for avoiding recurrence GC/Chlamydia pending Patient may return to MAU as needed or if her condition were to change or worsen  Kerry Starr, Kerry Richards  04/11/2014, 9:00 PM

## 2014-04-11 NOTE — Discharge Instructions (Signed)
Bacterial Vaginosis Bacterial vaginosis is a vaginal infection that occurs when the normal balance of bacteria in the vagina is disrupted. It results from an overgrowth of certain bacteria. This is the most common vaginal infection in women of childbearing age. Treatment is important to prevent complications, especially in pregnant women, as it can cause a premature delivery. CAUSES  Bacterial vaginosis is caused by an increase in harmful bacteria that are normally present in smaller amounts in the vagina. Several different kinds of bacteria can cause bacterial vaginosis. However, the reason that the condition develops is not fully understood. RISK FACTORS Certain activities or behaviors can put you at an increased risk of developing bacterial vaginosis, including:  Having a new sex partner or multiple sex partners.  Douching.  Using an intrauterine device (IUD) for contraception. Women do not get bacterial vaginosis from toilet seats, bedding, swimming pools, or contact with objects around them. SIGNS AND SYMPTOMS  Some women with bacterial vaginosis have no signs or symptoms. Common symptoms include:  Grey vaginal discharge.  A fishlike odor with discharge, especially after sexual intercourse.  Itching or burning of the vagina and vulva.  Burning or pain with urination. DIAGNOSIS  Your health care provider will take a medical history and examine the vagina for signs of bacterial vaginosis. A sample of vaginal fluid may be taken. Your health care provider will look at this sample under a microscope to check for bacteria and abnormal cells. A vaginal pH test may also be done.  TREATMENT  Bacterial vaginosis may be treated with antibiotic medicines. These may be given in the form of a pill or a vaginal cream. A second round of antibiotics may be prescribed if the condition comes back after treatment.  HOME CARE INSTRUCTIONS   Only take over-the-counter or prescription medicines as  directed by your health care provider.  If antibiotic medicine was prescribed, take it as directed. Make sure you finish it even if you start to feel better.  Do not have sex until treatment is completed.  Tell all sexual partners that you have a vaginal infection. They should see their health care provider and be treated if they have problems, such as a mild rash or itching.  Practice safe sex by using condoms and only having one sex partner. SEEK MEDICAL CARE IF:   Your symptoms are not improving after 3 days of treatment.  You have increased discharge or pain.  You have a fever. MAKE SURE YOU:   Understand these instructions.  Will watch your condition.  Will get help right away if you are not doing well or get worse. FOR MORE INFORMATION  Centers for Disease Control and Prevention, Division of STD Prevention: www.cdc.gov/std American Sexual Health Association (ASHA): www.ashastd.org  Document Released: 08/30/2005 Document Revised: 06/20/2013 Document Reviewed: 04/11/2013 Exit[employer] Patient Information 2015 [employer]. This information is not intended to replace advice given to you by your health care provider. Make sure you discuss any questions you have with your health care provider.  

## 2014-04-11 NOTE — MAU Note (Signed)
Pt requesting STD testing because of multiple new partners

## 2014-04-12 LAB — GC/CHLAMYDIA PROBE AMP
CT Probe RNA: NEGATIVE
GC PROBE AMP APTIMA: NEGATIVE

## 2014-04-12 NOTE — MAU Provider Note (Signed)
Attestation of Attending Supervision of Advanced Practitioner (CNM/NP): Evaluation and management procedures were performed by the Advanced Practitioner under my supervision and collaboration.  I have reviewed the Advanced Practitioner's note and chart, and I agree with the management and plan.  Tamer Baughman 04/12/2014 1:41 AM    

## 2014-07-15 ENCOUNTER — Encounter (HOSPITAL_COMMUNITY): Payer: Self-pay

## 2015-11-29 ENCOUNTER — Emergency Department (HOSPITAL_COMMUNITY)
Admission: EM | Admit: 2015-11-29 | Discharge: 2015-11-29 | Disposition: A | Discharge status: Home / Self Care | Payer: Self-pay | Attending: Emergency Medicine | Admitting: Emergency Medicine

## 2015-11-29 ENCOUNTER — Encounter (HOSPITAL_COMMUNITY): Payer: Self-pay | Admitting: Emergency Medicine

## 2015-11-29 DIAGNOSIS — I10 Essential (primary) hypertension: Secondary | ICD-10-CM | POA: Insufficient documentation

## 2015-11-29 DIAGNOSIS — Z8659 Personal history of other mental and behavioral disorders: Secondary | ICD-10-CM | POA: Insufficient documentation

## 2015-11-29 DIAGNOSIS — Z8619 Personal history of other infectious and parasitic diseases: Secondary | ICD-10-CM | POA: Insufficient documentation

## 2015-11-29 DIAGNOSIS — F17210 Nicotine dependence, cigarettes, uncomplicated: Secondary | ICD-10-CM | POA: Insufficient documentation

## 2015-11-29 DIAGNOSIS — Z792 Long term (current) use of antibiotics: Secondary | ICD-10-CM | POA: Insufficient documentation

## 2015-11-29 DIAGNOSIS — R202 Paresthesia of skin: Secondary | ICD-10-CM

## 2015-11-29 DIAGNOSIS — M792 Neuralgia and neuritis, unspecified: Secondary | ICD-10-CM | POA: Insufficient documentation

## 2015-11-29 DIAGNOSIS — Z87448 Personal history of other diseases of urinary system: Secondary | ICD-10-CM | POA: Insufficient documentation

## 2015-11-29 MED ORDER — TRAMADOL HCL 50 MG PO TABS
50.0000 mg | ORAL_TABLET | Freq: Once | ORAL | Status: AC
  Administered 2015-11-29: 50 mg via ORAL
  Filled 2015-11-29: qty 1

## 2015-11-29 MED ORDER — PREDNISONE 10 MG PO TABS: 40.0000 mg | ORAL_TABLET | Freq: Every day | ORAL | Status: DC

## 2015-11-29 MED ORDER — TRAMADOL HCL 50 MG PO TABS: 50.0000 mg | ORAL_TABLET | Freq: Two times a day (BID) | ORAL | Status: DC | PRN

## 2015-11-29 MED ORDER — PREDNISONE 20 MG PO TABS
60.0000 mg | ORAL_TABLET | Freq: Once | ORAL | Status: AC
  Administered 2015-11-29: 60 mg via ORAL
  Filled 2015-11-29: qty 3

## 2015-11-29 MED ORDER — KETOROLAC TROMETHAMINE 60 MG/2ML IM SOLN
60.0000 mg | Freq: Once | INTRAMUSCULAR | Status: AC
  Administered 2015-11-29: 60 mg via INTRAMUSCULAR
  Filled 2015-11-29: qty 2

## 2015-11-29 MED ORDER — IBUPROFEN 800 MG PO TABS: 800.0000 mg | ORAL_TABLET | Freq: Three times a day (TID) | ORAL | Status: DC

## 2015-11-29 NOTE — ED Provider Notes (Signed)
CSN: [id number]     Arrival date & time 11/29/15  1426 History   First MD Initiated Contact with Patient 11/29/15 1517     Chief Complaint  Patient presents with  . Numbness     (Consider location/radiation/quality/duration/timing/severity/associated sxs/prior Treatment) HPI   CC:  Right forearm pain and intermittent numbness.   The pt is a 25 y/o female, with hx hx of HTN, presents to the ER with left arm pain with associated intermittent numbness for the past several weeks.  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 Her arm pain is located just distal to [facility] fossa of right arm, described as deep, "like she wants to reach right in and grab it."  She has intermittent numbness and tingling extending from area of pain to wrist and fingers.  When these symptoms occurs she usually "shakes her arms" until the feeling goes away.  This morning she had all the same symptoms, but she noticed a discoloration and pallor of her right middle finger.  She shook her arms but the sx did not resolve for some time so she was very alarmed and came to the ER to get evaluated. Pt denies elbow, shoulder or wrist injury.  She denies swelling, redness.  No hx of tendenopathy, raynaud's, hypothyroid, carpel tunnel.  She does not have sx in her left arm.   No other complaints.  Past Medical History  Diagnosis Date  . Migraine   . Hypertension   . Anxiety   . Trichomonas contact, treated   . Hx of pyelonephritis    Past Surgical History  Procedure Laterality Date  . Tooth extraction    . Wisdom tooth extraction    . Dilation and curettage of uterus    . Cesarean section  08/24/2012    Procedure: CESAREAN SECTION;  Surgeon: Hillis Pila, MD;  Location: [facility];  Service: Obstetrics;  Laterality: N/A;  Primary Cesarean Section Delivery Baby  Boy @ 0004,  Apgars 9/9   Family History  Problem Relation Age of Onset  . Anesthesia problems Neg Hx   . Other Neg Hx   . Hypertension Father   . Migraines Father   . Stroke Father   . Arthritis Maternal Aunt   . Arthritis Maternal Grandmother   . Cancer Maternal Grandmother     lung  . Diabetes Paternal Grandfather   . Heart disease Paternal Grandfather   . Hypertension Paternal Grandfather   . Heart disease Paternal Aunt    Social History  Substance Use Topics  . Smoking status: Current Every Day Smoker -- 0.50 packs/day    Types: Cigarettes    Last Attempt to Quit: 09/30/2011  . Smokeless tobacco: Never Used  . Alcohol Use: 0.0 oz/week   OB History    Gravida Para Term Preterm AB TAB SAB Ectopic Multiple Living   0 Review of Systems  All other systems reviewed and are negative.     Allergies  Shellfish allergy; Amitriptyline; and Metronidazole  Home Medications   Prior to Admission medications   Medication Sig Start Date End Date Taking? Authorizing Provider  clindamycin (CLEOCIN) 300 MG capsule Take 1 capsule (300 mg total) by mouth 2 (two) times daily. 04/11/14   Marny Lowenstein, PA-C  ibuprofen (ADVIL,MOTRIN) 200 MG tablet Take  800 mg by mouth every 6 (six) hours as needed for moderate pain.    Historical Provider, MD   BP 151/94 mmHg  Pulse 78  Temp(Src) 98.5 F (36.9 C) (Oral)  Resp 16  SpO2 99% Physical Exam  Constitutional: She is oriented to person, place, and time. She appears well-developed and well-nourished. No distress.  HENT:  Head: Normocephalic and atraumatic.  Right Ear: External ear normal.  Left Ear: External ear normal.  Nose: Nose normal.  Mouth/Throat: Oropharynx is clear and moist. No oropharyngeal exudate.  Eyes: Conjunctivae and EOM are normal. Pupils are equal, round, and reactive to light. Right eye exhibits no discharge. Left eye exhibits no discharge. No scleral icterus.  Neck: Normal range of motion. Neck supple. No  JVD present. No tracheal deviation present.  Cardiovascular: Normal rate and regular rhythm.   Pulmonary/Chest: Effort normal and breath sounds normal. No stridor. No respiratory distress.  Musculoskeletal: She exhibits no edema or tenderness.       Right elbow: Normal.She exhibits normal range of motion, no swelling, no effusion, no deformity and no laceration. No tenderness found.       Right wrist: Normal. She exhibits normal range of motion, no tenderness, no bony tenderness, no swelling, no effusion, no crepitus, no deformity and no laceration.       Right forearm: She exhibits no tenderness, no bony tenderness, no swelling, no edema and no laceration.       Right hand: Normal. She exhibits normal range of motion, no tenderness, normal two-point discrimination, normal capillary refill and no swelling. Normal sensation noted. Normal strength noted.  Negative tinel's sign, negative phalen's, negative finkelsteins test  Right arm, elbow, forearm, wrist, fingers, no pallor, no erythema, no edema.  No ttp.  Lymphadenopathy:    She has no cervical adenopathy.  Neurological: She is alert and oriented to person, place, and time. She exhibits normal muscle tone. Coordination normal.  Skin: Skin is warm and dry. No rash noted. She is not diaphoretic. No erythema. No pallor.  Psychiatric: She has a normal mood and affect. Her behavior is normal. Judgment and thought content normal.  Nursing note and vitals reviewed.    ED Course  Procedures (including critical care time) Labs Review Labs Reviewed - No data to display  Imaging Review No results found. I have personally reviewed and evaluated these images and lab results as part of my medical decision-making.   EKG Interpretation None      MDM   Pt with intermittent arm pain with paresthesias from right anterior medial forearm to wrist and fingers in ulnar distribution.  Suspect pt has neuritis with frequent supination and pronation.  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  Pt had negative tinel's sign, negative phalen's, do not suspect carpel tunnel as it is not reproducible, and no local wrist pain.  I discussed with the pt whether she would like to try a wrist brace and see if sx improve, but I believe the cause is more proximal than wrist.  We discussed getting an xray, but I believe it would be negative, and there was no injury, no bony abnormality palpated, and I do no believe it is indicated.  Pt has good pulses, brisk capillary refill, no numbness currently present.  No pain currently, no erythema or edema.  No concern for infection or vascular compromise.  Pt advised to rest arm, use NSAIDS, ice, and will give steroid burst.  Pt given toradol shot in  ER with oral dose of prednisone.  She did not want a work note, and I explained that the inflammation may not improve if she continues the same activity.    Given ortho follow up, I provided a work note with d/c paperwork.  She stated she would limit use of her right arm for several days and see if it improves.    Pt d/c in good condition.     Final diagnoses:  Neuritis  Paresthesia of arm        Danelle BerryLeisa Malan Werk, PA-C 12/02/15 2232  Tilden FossaElizabeth Rees, MD 12/06/15 [phone]

## 2015-11-29 NOTE — ED Notes (Signed)
Patient here from home with complaints of right sided arm numbness intermitted x1 week. States that today while chopping vegetables her right arm went numb and fingers turned blue.

## 2015-11-29 NOTE — Discharge Instructions (Signed)
Peripheral Neuropathy Peripheral neuropathy is a type of nerve damage. It affects nerves that carry signals between the spinal cord and other parts of the body. These are called peripheral nerves. With peripheral neuropathy, one nerve or a group of nerves may be damaged.  CAUSES  Many things can damage peripheral nerves. For some people with peripheral neuropathy, the cause is unknown. Some causes include:  Diabetes. This is the most common cause of peripheral neuropathy.  Injury to a nerve.  Pressure or stress on a nerve that lasts a long time.  Too little vitamin B. Alcoholism can lead to this.  Infections.  Autoimmune diseases, such as multiple sclerosis and systemic lupus erythematosus.  Inherited nerve diseases.  Some medicines, such as cancer drugs.  Toxic substances, such as lead and mercury.  Too little blood flowing to the legs.  Kidney disease.  Thyroid disease. SIGNS AND SYMPTOMS  Different people have different symptoms. The symptoms you have will depend on which of your nerves is damaged. Common symptoms include:  Loss of feeling (numbness) in the feet and hands.  Tingling in the feet and hands.  Pain that burns.  Very sensitive skin.  Weakness.  Not being able to move a part of the body (paralysis).  Muscle twitching.  Clumsiness or poor coordination.  Loss of balance.  Not being able to control your bladder.  Feeling dizzy.  Sexual problems. DIAGNOSIS  Peripheral neuropathy is a symptom, not a disease. Finding the cause of peripheral neuropathy can be hard. To figure that out, your health care provider will take a medical history and do a physical exam. A neurological exam will also be done. This involves checking things affected by your brain, spinal cord, and nerves (nervous system). For example, your health care provider will check your reflexes, how you move, and what you can feel.  Other types of tests may also be ordered, such  as:  Blood tests.  A test of the fluid in your spinal cord.  Imaging tests, such as CT scans or an MRI.  Electromyography (EMG). This test checks the nerves that control muscles.  Nerve conduction velocity tests. These tests check how fast messages pass through your nerves.  Nerve biopsy. A small piece of nerve is removed. It is then checked under a microscope. TREATMENT   Medicine is often used to treat peripheral neuropathy. Medicines may include:  Pain-relieving medicines. Prescription or over-the-counter medicine may be suggested.  Antiseizure medicine. This may be used for pain.  Antidepressants. These also may help ease pain from neuropathy.  Lidocaine. This is a numbing medicine. You might wear a patch or be given a shot.  Mexiletine. This medicine is typically used to help control irregular heart rhythms.  Surgery. Surgery may be needed to relieve pressure on a nerve or to destroy a nerve that is causing pain.  Physical therapy to help movement.  Assistive devices to help movement. HOME CARE INSTRUCTIONS   Only take over-the-counter or prescription medicines as directed by your health care provider. Follow the instructions carefully for any given medicines. Do not take any other medicines without first getting approval from your health care provider.  If you have diabetes, work closely with your health care provider to keep your blood sugar under control.  If you have numbness in your feet:  Check every day for signs of injury or infection. Watch for redness, warmth, and swelling.  Wear padded socks and comfortable shoes. These help protect your feet.  Do not do  things that put pressure on your damaged nerve.  Do not smoke. Smoking keeps blood from getting to damaged nerves.  Avoid or limit alcohol. Too much alcohol can cause a lack of B vitamins. These vitamins are needed for healthy nerves.  Develop a good support system. Coping with peripheral neuropathy  can be stressful. Talk to a mental health specialist or join a support group if you are struggling.  Follow up with your health care provider as directed. SEEK MEDICAL CARE IF:   You have new signs or symptoms of peripheral neuropathy.  You are struggling emotionally from dealing with peripheral neuropathy.  You have a fever. SEEK IMMEDIATE MEDICAL CARE IF:   You have an injury or infection that is not healing.  You feel very dizzy or begin vomiting.  You have chest pain.  You have trouble breathing.   This information is not intended to replace advice given to you by your health care provider. Make sure you discuss any questions you have with your health care provider.   Document Released: 08/20/2002 Document Revised: 05/12/2011 Document Reviewed: 05/07/2013 Elsevier Interactive Patient Education 2016 Else[employer] Inc.  Paresthesia Paresthesia is an abnormal burning or prickling sensation. This sensation is generally felt in the hands, arms, legs, or feet. However, it may occur in any part of the body. Usually, it is not painful. The feeling may be described as:  Tingling or numbness.  Pins and needles.  Skin crawling.  Buzzing.  Limbs falling asleep.  Itching. Most people experience temporary (transient) paresthesia at some time in their lives. Paresthesia may occur when you breathe too quickly (hyperventilation). It can also occur without any apparent cause. Commonly, paresthesia occurs when pressure is placed on a nerve. The sensation quickly goes away after the pressure is removed. For some people, however, paresthesia is a long-lasting (chronic) condition that is caused by an underlying disorder. If you continue to have paresthesia, you may need further medical evaluation. HOME CARE INSTRUCTIONS Watch your condition for any changes. Taking the following actions may help to lessen any discomfort that you are feeling:  Avoid drinking alcohol.  Try acupuncture or massage  to help relieve your symptoms.  Keep all follow-up visits as directed by your health care provider. This is important. SEEK MEDICAL CARE IF:  You continue to have episodes of paresthesia.  Your burning or prickling feeling gets worse when you walk.  You have pain, cramps, or dizziness.  You develop a rash. SEEK IMMEDIATE MEDICAL CARE IF:  You feel weak.  You have trouble walking or moving.  You have problems with speech, understanding, or vision.  You feel confused.  You cannot control your bladder or bowel movements.  You have numbness after an injury.  You faint.   This information is not intended to replace advice given to you by your health care provider. Make sure you discuss any questions you have with your health care provider.   Document Released: 08/20/2002 Document Revised: 01/14/2015 Document Reviewed: 08/26/2014 Elsevier Interactive Patient Education [employer]

## 2016-02-07 ENCOUNTER — Encounter (HOSPITAL_COMMUNITY): Payer: Self-pay | Admitting: Emergency Medicine

## 2016-02-07 ENCOUNTER — Emergency Department (HOSPITAL_COMMUNITY)
Admission: EM | Admit: 2016-02-07 | Discharge: 2016-02-07 | Disposition: A | Discharge status: Home / Self Care | Payer: Medicaid Other | Attending: Emergency Medicine | Admitting: Emergency Medicine

## 2016-02-07 ENCOUNTER — Emergency Department (HOSPITAL_COMMUNITY): Payer: Medicaid Other

## 2016-02-07 DIAGNOSIS — Z791 Long term (current) use of non-steroidal anti-inflammatories (NSAID): Secondary | ICD-10-CM | POA: Insufficient documentation

## 2016-02-07 DIAGNOSIS — F17210 Nicotine dependence, cigarettes, uncomplicated: Secondary | ICD-10-CM | POA: Insufficient documentation

## 2016-02-07 DIAGNOSIS — Z7952 Long term (current) use of systemic steroids: Secondary | ICD-10-CM | POA: Insufficient documentation

## 2016-02-07 DIAGNOSIS — R112 Nausea with vomiting, unspecified: Secondary | ICD-10-CM | POA: Insufficient documentation

## 2016-02-07 DIAGNOSIS — I10 Essential (primary) hypertension: Secondary | ICD-10-CM | POA: Insufficient documentation

## 2016-02-07 DIAGNOSIS — K5900 Constipation, unspecified: Secondary | ICD-10-CM | POA: Insufficient documentation

## 2016-02-07 LAB — I-STAT BETA HCG BLOOD, ED (MC, WL, AP ONLY): I-stat hCG, quantitative: <5 m[IU]/mL (ref <5)

## 2016-02-07 LAB — CBC WITH DIFFERENTIAL/PLATELET
BASOS ABS: 0 10*3/uL (ref 0.0–0.1)
BASOS PCT: 0 %
Eosinophils Absolute: 0.1 10*3/uL (ref 0.0–0.7)
Eosinophils Relative: 0 %
HEMATOCRIT: 40.5 % (ref 36.0–46.0)
HEMOGLOBIN: 14 g/dL (ref 12.0–15.0)
Lymphocytes Relative: 17 %
Lymphs Abs: 2.3 10*3/uL (ref 0.7–4.0)
MCH: 30.6 pg (ref 26.0–34.0)
MCHC: 34.6 g/dL (ref 30.0–36.0)
MCV: 88.4 fL (ref 78.0–100.0)
MONO ABS: 1.2 10*3/uL — AB (ref 0.1–1.0)
Monocytes Relative: 9 %
NEUTROS ABS: 10.1 10*3/uL — AB (ref 1.7–7.7)
NEUTROS PCT: 74 %
Platelets: 356 10*3/uL (ref 150–400)
RBC: 4.58 MIL/uL (ref 3.87–5.11)
RDW: 12.4 % (ref 11.5–15.5)
WBC: 13.7 10*3/uL — AB (ref 4.0–10.5)

## 2016-02-07 LAB — COMPREHENSIVE METABOLIC PANEL
ALBUMIN: 4.6 g/dL (ref 3.5–5.0)
ALT: 12 U/L — AB (ref 14–54)
AST: 15 U/L (ref 15–41)
Alkaline Phosphatase: 73 U/L (ref 38–126)
Anion gap: 11 (ref 5–15)
BILIRUBIN TOTAL: 0.6 mg/dL (ref 0.3–1.2)
BUN: 10 mg/dL (ref 6–20)
CO2: 24 mmol/L (ref 22–32)
Calcium: 9.3 mg/dL (ref 8.9–10.3)
Chloride: 102 mmol/L (ref 101–111)
Creatinine, Ser: 0.74 mg/dL (ref 0.44–1.00)
GFR calc Af Amer: >60 mL/min (ref >60)
GFR calc non Af Amer: >60 mL/min (ref >60)
GLUCOSE: 99 mg/dL (ref 65–99)
POTASSIUM: 3.8 mmol/L (ref 3.5–5.1)
Sodium: 137 mmol/L (ref 135–145)
TOTAL PROTEIN: 8.5 g/dL — AB (ref 6.5–8.1)

## 2016-02-07 LAB — LIPASE, BLOOD: Lipase: 28 U/L (ref 11–51)

## 2016-02-07 MED ORDER — SODIUM CHLORIDE 0.9 % IV BOLUS (SEPSIS)
1000.0000 mL | Freq: Once | INTRAVENOUS | Status: AC
  Administered 2016-02-07: 1000 mL via INTRAVENOUS

## 2016-02-07 MED ORDER — IOPAMIDOL (ISOVUE-300) INJECTION 61%
100.0000 mL | Freq: Once | INTRAVENOUS | Status: AC | PRN
  Administered 2016-02-07: 100 mL via INTRAVENOUS

## 2016-02-07 MED ORDER — ONDANSETRON HCL 4 MG/2ML IJ SOLN
4.0000 mg | Freq: Once | INTRAMUSCULAR | Status: AC
  Administered 2016-02-07: 4 mg via INTRAVENOUS
  Filled 2016-02-07: qty 2

## 2016-02-07 MED ORDER — POLYETHYLENE GLYCOL 3350 17 GM/SCOOP PO POWD: 17.0000 g | Freq: Two times a day (BID) | ORAL | Status: DC

## 2016-02-07 MED ORDER — MAGNESIUM CITRATE PO SOLN: 296.0000 mL | Freq: Once | ORAL | Status: DC

## 2016-02-07 NOTE — ED Provider Notes (Signed)
CSN: [id number]     Arrival date & time 02/07/16  1931 History   First MD Initiated Contact with Patient 02/07/16 1944     Chief Complaint  Patient presents with  . Abdominal Pain  . Constipation  . Emesis     (Consider location/radiation/quality/duration/timing/severity/associated sxs/prior Treatment) HPI   Patient is a 25 year old female past medical history of migraine, hypertension and anxiety who presents to the ED with complaint of abdominal pain, onset 8 days. Patient reports having constant worsening mid upper abdominal pain with associated constipation. She notes her last bowel movement was 8 days ago. Patient started she started taking Vicodin 2 weeks ago for a foot injury. Endorses associated N/V. She reports having multiple episodes NBNB vomiting and reports she has not been able to keep any fluids or food down for the past 3 days. Patient reports she has tried over-the-counter laxatives, suppository without relief. She states she used an enema last night with only a small watery BM reported. Denies fever, chills, cough, SOB, CP, hematemesis, diarrhea, urinary sxs, vaginal bleeding, vaginal d/c, blood in urine or stool. Endorses abdominal surgical hx of c-section.   Past Medical History  Diagnosis Date  . Migraine   . Hypertension   . Anxiety   . Trichomonas contact, treated   . Hx of pyelonephritis    Past Surgical History  Procedure Laterality Date  . Tooth extraction    . Wisdom tooth extraction    . Dilation and curettage of uterus    . Cesarean section  08/24/2012    Procedure: CESAREAN SECTION;  Surgeon: Yusko Pila, MD;  Location: [facility];  Service: Obstetrics;  Laterality: N/A;  Primary Cesarean Section Delivery Baby  Boy @ 0004, Apgars 9/9   Family History  Problem Relation Age of Onset  . Anesthesia problems Neg Hx   . Other Neg Hx   . Hypertension Father   . Migraines Father   . Stroke Father   . Arthritis Maternal Aunt   . Arthritis Maternal  Grandmother   . Cancer Maternal Grandmother     lung  . Diabetes Paternal Grandfather   . Heart disease Paternal Grandfather   . Hypertension Paternal Grandfather   . Heart disease Paternal Aunt    Social History  Substance Use Topics  . Smoking status: Current Every Day Smoker -- 0.50 packs/day    Types: Cigarettes    Last Attempt to Quit: 09/30/2011  . Smokeless tobacco: Never Used  . Alcohol Use: 0.0 oz/week   OB History    Gravida Para Term Preterm AB TAB SAB Ectopic Multiple Living   0 Review of Systems  Gastrointestinal: Positive for nausea, vomiting, abdominal pain and constipation.  All other systems reviewed and are negative.     Allergies  Shellfish allergy; Amitriptyline; and Metronidazole  Home Medications   Prior to Admission medications   Medication Sig Start Date End Date Taking? Authorizing Provider  ibuprofen (ADVIL,MOTRIN) 800 MG tablet Take 1 tablet (800 mg total) by mouth 3 (three) times daily. Patient not taking: Reported on 02/07/2016 11/29/15   Danelle Berry, PA-C  magnesium citrate solution Take 296 mLs by mouth once. OTC 02/07/16   Barrett Henle, PA-C  polyethylene glycol powder (GLYCOLAX/MIRALAX) powder Take 17 g by mouth 2 (two) times daily. Until daily soft stools  OTC 02/07/16   Barrett Henle, PA-C  predniSONE (DELTASONE) 10 MG tablet Take 4 tablets (40  mg total) by mouth daily. Patient not taking: Reported on 02/07/2016 11/29/15   Danelle Berry, PA-C  traMADol (ULTRAM) 50 MG tablet Take 1 tablet (50 mg total) by mouth every 12 (twelve) hours as needed for severe pain. Patient not taking: Reported on 02/07/2016 11/29/15   Danelle Berry, PA-C   BP 137/78 mmHg  Pulse 71  Temp(Src) 98.4 F (36.9 C) (Oral)  Resp 18  SpO2 100%  LMP 01/20/2016 Physical Exam  Constitutional: She is oriented to person, place, and time. She appears well-developed and well-nourished.  Pt appears mildly anxious and in mild discomfort.   HENT:  Head: Normocephalic and atraumatic.  Mouth/Throat: Oropharynx is clear and moist. No oropharyngeal exudate.  Eyes: Conjunctivae and EOM are normal. Right eye exhibits no discharge. Left eye exhibits no discharge. No scleral icterus.  Neck: Normal range of motion. Neck supple.  Cardiovascular: Normal rate, regular rhythm, normal heart sounds and intact distal pulses.   Pulmonary/Chest: Effort normal and breath sounds normal. No respiratory distress. She has no wheezes. She has no rales. She exhibits no tenderness.  Abdominal: Soft. Bowel sounds are normal. She exhibits no distension and no mass. There is tenderness (upper abdominal tenderness). There is no rebound and no guarding.  Musculoskeletal: Normal range of motion. She exhibits no edema.  Lymphadenopathy:    She has no cervical adenopathy.  Neurological: She is alert and oriented to person, place, and time.  Skin: Skin is warm and dry. She is not diaphoretic.  Nursing note and vitals reviewed.   ED Course  Procedures (including critical care time) Labs Review Labs Reviewed  CBC WITH DIFFERENTIAL/PLATELET - Abnormal; Notable for the following:    WBC 13.7 (*)    Neutro Abs 10.1 (*)    Monocytes Absolute 1.2 (*)    All other components within normal limits  COMPREHENSIVE METABOLIC PANEL - Abnormal; Notable for the following:    Total Protein 8.5 (*)    ALT 12 (*)    All other components within normal limits  LIPASE, BLOOD  I-STAT BETA HCG BLOOD, ED ([facility], [facility], [facility] ONLY)    Imaging Review Ct Abdomen Pelvis W Contrast  02/07/2016  CLINICAL DATA:  Initial evaluation for acute mid upper abdominal pain and constipation. EXAM: CT ABDOMEN AND PELVIS WITH CONTRAST TECHNIQUE: Multidetector CT imaging of the abdomen and pelvis was performed using the standard protocol following bolus administration of intravenous contrast. CONTRAST:  ISOVUE-300 IOPAMIDOL (ISOVUE-300) INJECTION 61% COMPARISON:  None. FINDINGS: Visualized lung  bases are clear. The liver demonstrates a normal contrast enhanced appearance. Gallbladder within normal limits. No biliary dilatation. Spleen, adrenal glands, and pancreas demonstrate a normal contrast enhanced appearance. Kidneys are equal in size with symmetric enhancement. No nephrolithiasis, hydronephrosis, or focal enhancing renal mass. Tiny hypodensity within the inferior pole left kidney noted, too small the characterize, but statistically likely reflects a small cyst. Stomach within normal limits. No evidence for bowel obstruction. No wall thickening, mucosal enhancement, or inflammatory fat stranding seen about the bowels. Appendix visualized in the right lower quadrant and is of normal caliber and appearance without associated inflammatory changes to suggest acute appendicitis. Bladder within normal limits. Uterus and ovaries normal for patient age. Small volume free fluid within the pelvis, likely physiologic. No free air. No pathologically enlarged intra-abdominal or pelvic lymph nodes. Normal intravascular enhancement seen throughout the intra-abdominal aorta and its branch vessels. No acute osseous abnormality. No worrisome lytic or blastic osseous lesions. IMPRESSION: No CT evidence for acute intra-abdominal or pelvic  process. Electronically Signed   By: Rise MuBenjamin  McClintock M.D.   On: 02/07/2016 22:57   I have personally reviewed and evaluated these images and lab results as part of my medical decision-making.   EKG Interpretation None      MDM   Final diagnoses:  Constipation, unspecified constipation type    Patient presents with constipation with associated abdominal pain and nausea, vomiting. Denies fever. Patient reports she has been taking Vicodin for the past 2 weeks for foot injury. She states she has used over-the-counter laxatives, suppository and enema without relief. VSS. Exam revealed upper abdominal tenderness, no peritoneal signs. Patient given IV fluids. WBC 13.7.  Remaining labs unremarkable. Pregnancy negative. CT abdomen negative. I suspect patient's symptoms are likely due to to constipation associated with narcotic use. Patient able tolerate by mouth in the ED. On reevaluation patient reports her abdominal pain has improved. Discussed results and plan for discharge with patient. Plan to discharge patient home with bowel regimen including MiraLAX and magnesium citrate. Patient given resources to follow up with PCP the next week as needed. Discussed return precautions with patient.    Satira Sarkicole Elizabeth Pine BluffNadeau, [place] JerseyPA-C 02/08/16 0050  Lyndal Pulleyaniel Knott, MD 02/08/16 [phone]

## 2016-02-07 NOTE — ED Notes (Signed)
Patient c/o mid upper abdominal pain and constipation x8 days. Reports minimal relief with enema, and "lots" of vomiting.

## 2016-02-07 NOTE — Discharge Instructions (Signed)
I recommend drinking 1 bottle of magnesium citrate and taking MiraLAX twice daily until you have a bowel movement. It is also important to drink at least 1.5 liters of water daily to remain hydrated. I recommend refraining from taking your vicodin pain medications until your constipation has resolved. It is important to remain active while at home to help with your constipation.  Please follow up with a primary care provider from the Resource Guide provided below in 1 week if your constipation has not improved. Please return to the Emergency Department if symptoms worsen or new onset of fever, worsening/new abdominal pain, vomiting, diarrhea, blood in urine or stool, vomiting blood.

## 2017-01-01 IMAGING — CT CT ABD-PELV W/ CM
2 of 4 series · 16 of 46 positions shown, 18 images · IV contrast (ISOVUE)
Comparison: None.

CLINICAL DATA: Initial evaluation for acute mid upper abdominal
pain and constipation.

EXAM:
CT ABDOMEN AND PELVIS WITH CONTRAST
TECHNIQUE: Multidetector CT imaging of the abdomen and pelvis was performed
using the standard protocol following bolus administration of
intravenous contrast.
CONTRAST:  100mL ME641Y-388 IOPAMIDOL (ME641Y-388) INJECTION 61%

[Series 2: abd/pel with · axial · 0.92mm/px · z∈[+1170,+1574]mm · 13 of 93 slices shown, 15 images]
[im 6/93  soft-tissue]
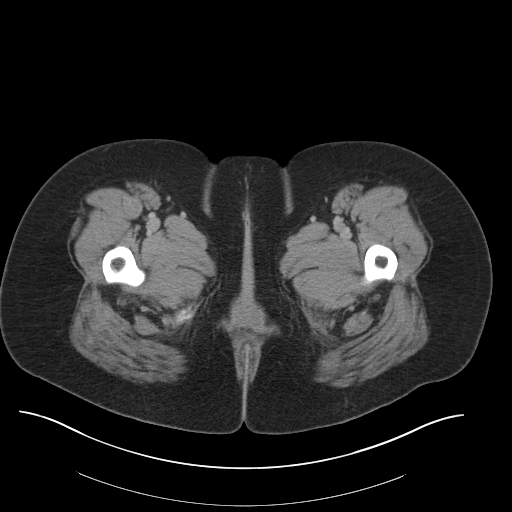
[im 6/93  bone]
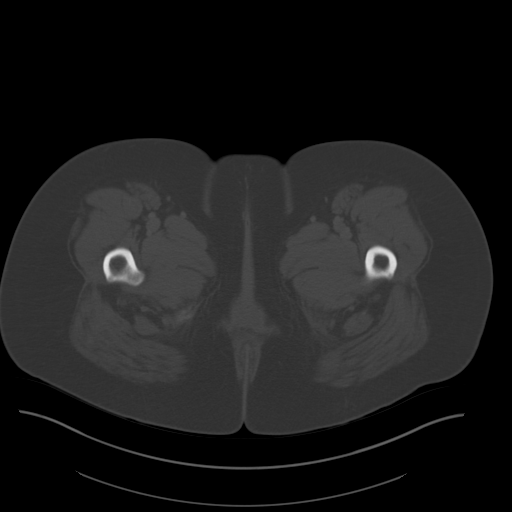
[im 11/93  soft-tissue]
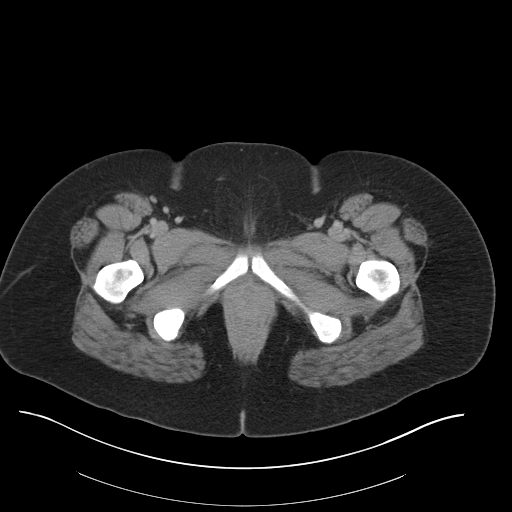
[im 21/93  soft-tissue]
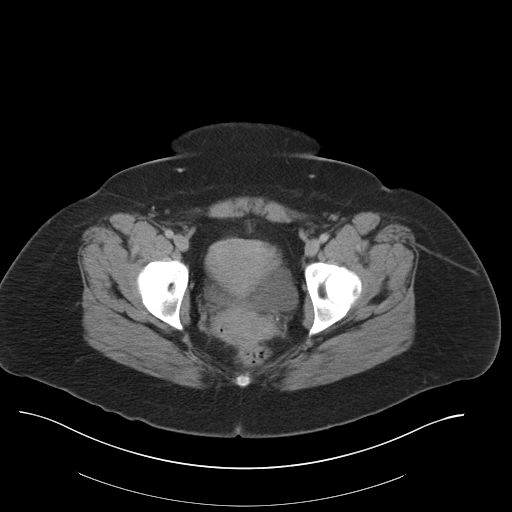
[im 26/93  soft-tissue]
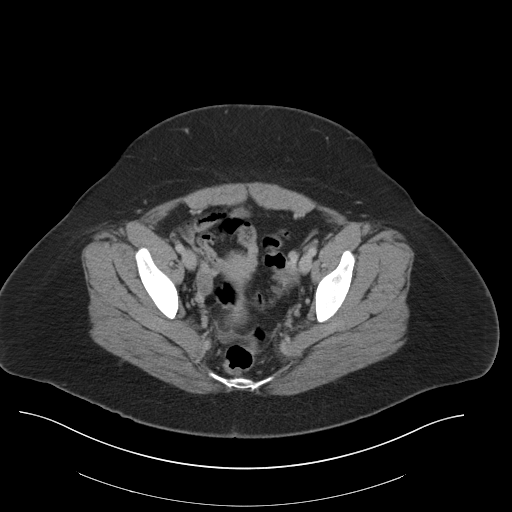
[im 31/93  soft-tissue]
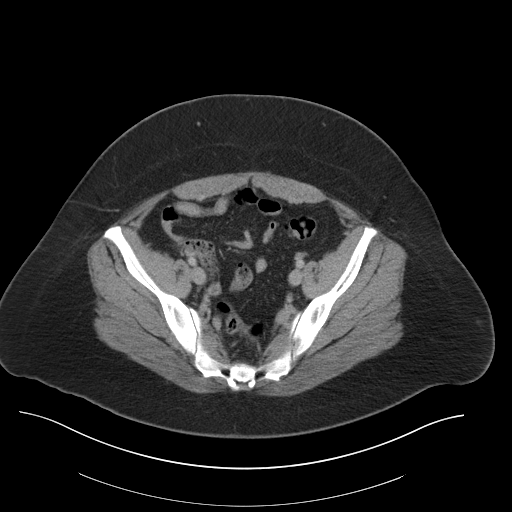
[im 41/93  soft-tissue]
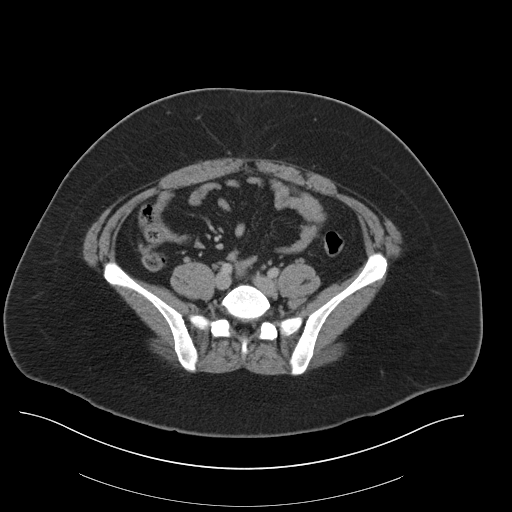
[im 47/93  soft-tissue]
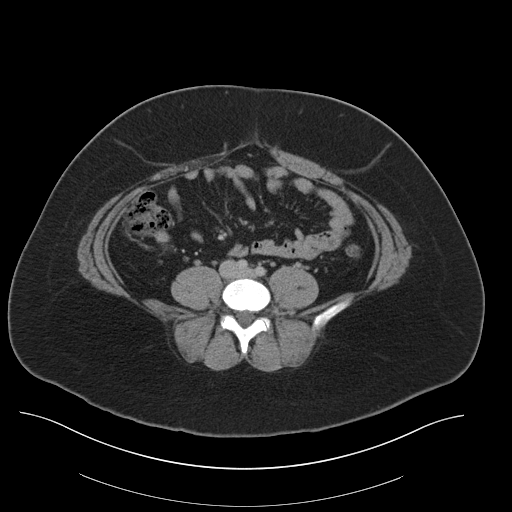
[im 52/93  soft-tissue]
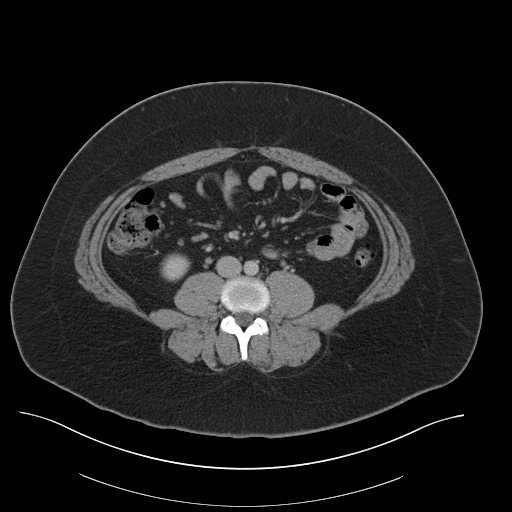
[im 62/93  soft-tissue]
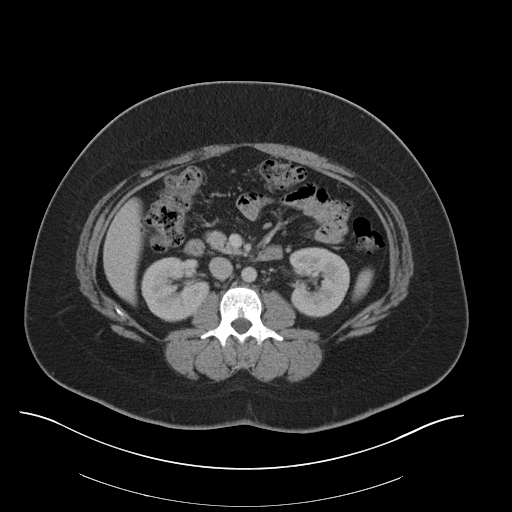
[im 62/93  bone]
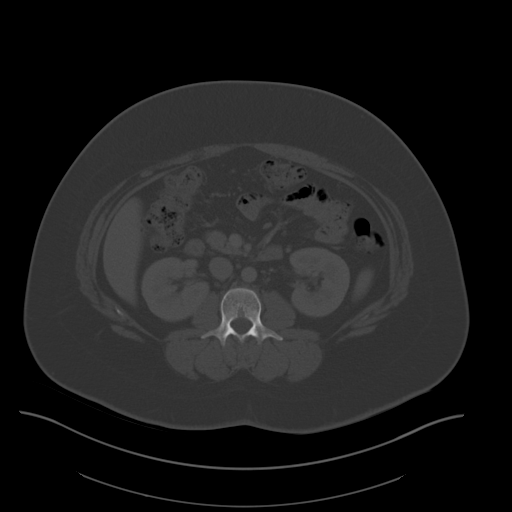
[im 67/93  soft-tissue]
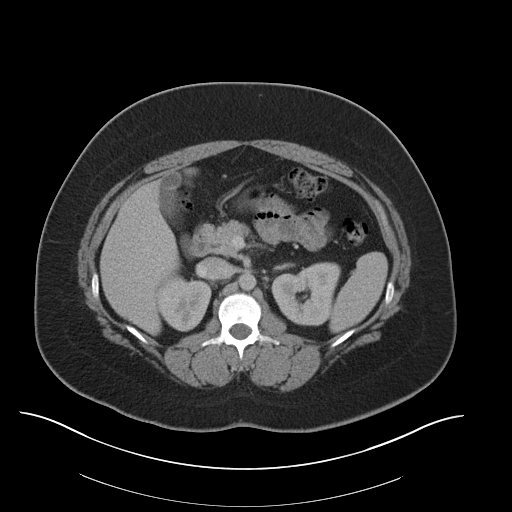
[im 72/93  soft-tissue]
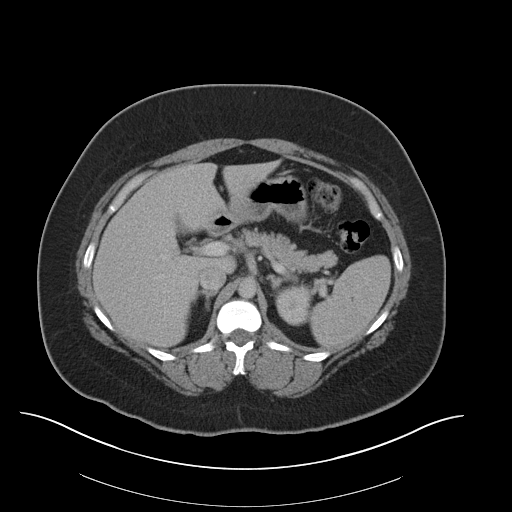
[im 82/93  soft-tissue]
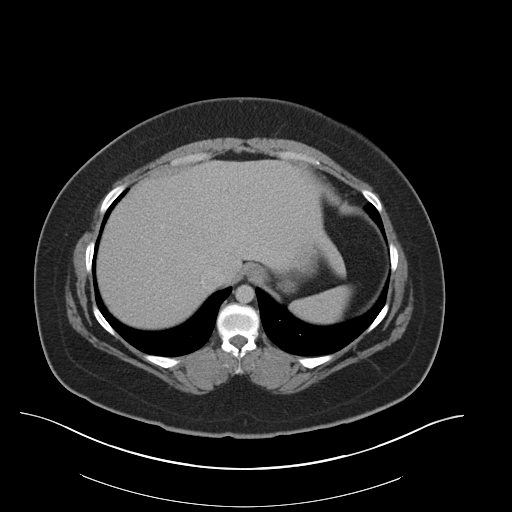
[im 87/93  soft-tissue]
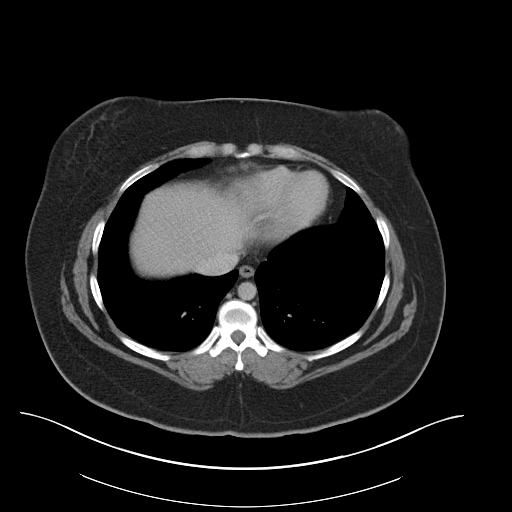

[Series 3: coronal a/|p · coronal · 0.83mm/px · 3 of 191 slices shown]
[im 64/191  soft-tissue]
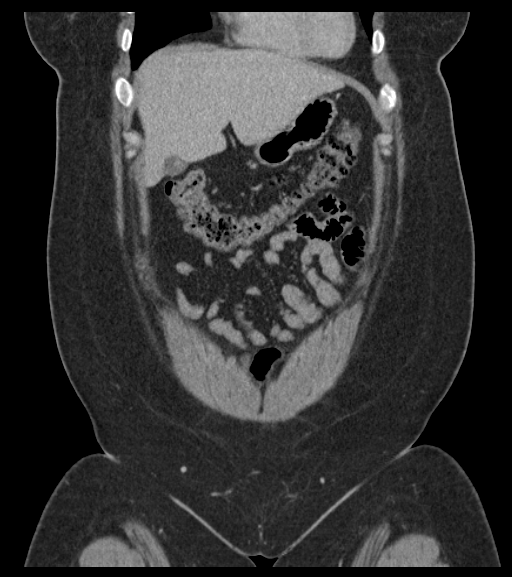
[im 85/191  soft-tissue]
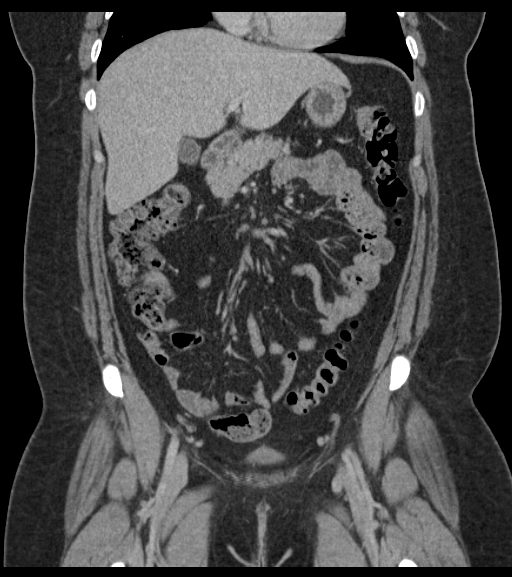
[im 106/191  soft-tissue]
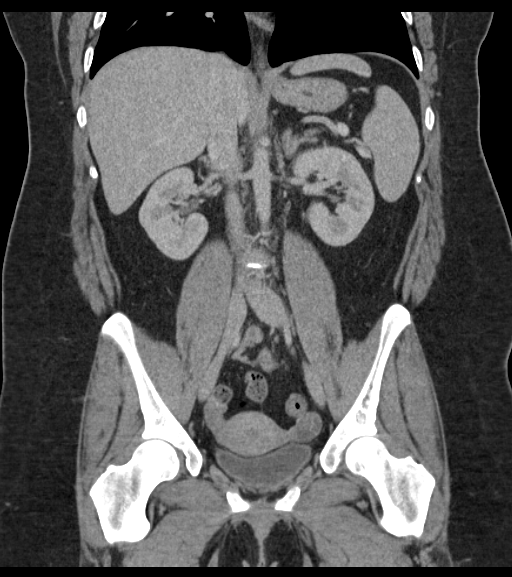

[16 of 46 positions shown; findings below may reference images not displayed]

FINDINGS: Visualized lung bases are clear.

The liver demonstrates a normal contrast enhanced appearance.
Gallbladder within normal limits. No biliary dilatation. Spleen,
adrenal glands, and pancreas demonstrate a normal contrast enhanced
appearance.

Kidneys are equal in size with symmetric enhancement. No
nephrolithiasis, hydronephrosis, or focal enhancing renal mass. Tiny
hypodensity within the inferior pole left kidney noted, too small
the characterize, but statistically likely reflects a small cyst.

Stomach within normal limits. No evidence for bowel obstruction. No
wall thickening, mucosal enhancement, or inflammatory fat stranding
seen about the bowels. Appendix visualized in the right lower
quadrant and is of normal caliber and appearance without associated
inflammatory changes to suggest acute appendicitis.

Bladder within normal limits. Uterus and ovaries normal for patient
age.

Small volume free fluid within the pelvis, likely physiologic. No
free air. No pathologically enlarged intra-abdominal or pelvic lymph
nodes. Normal intravascular enhancement seen throughout the
intra-abdominal aorta and its branch vessels.

No acute osseous abnormality. No worrisome lytic or blastic osseous
lesions.
IMPRESSION: No CT evidence for acute intra-abdominal or pelvic process.

## 2017-10-06 ENCOUNTER — Ambulatory Visit: Payer: Self-pay | Admitting: Adult Health

## 2017-10-17 ENCOUNTER — Encounter (HOSPITAL_COMMUNITY): Payer: Self-pay | Admitting: Emergency Medicine

## 2017-10-17 ENCOUNTER — Emergency Department (HOSPITAL_COMMUNITY)
Admission: EM | Admit: 2017-10-17 | Discharge: 2017-10-17 | Disposition: A | Discharge status: Home / Self Care | Payer: Self-pay | Attending: Emergency Medicine | Admitting: Emergency Medicine

## 2017-10-17 DIAGNOSIS — J029 Acute pharyngitis, unspecified: Secondary | ICD-10-CM | POA: Insufficient documentation

## 2017-10-17 DIAGNOSIS — R42 Dizziness and giddiness: Secondary | ICD-10-CM | POA: Insufficient documentation

## 2017-10-17 DIAGNOSIS — R51 Headache: Secondary | ICD-10-CM | POA: Insufficient documentation

## 2017-10-17 DIAGNOSIS — R112 Nausea with vomiting, unspecified: Secondary | ICD-10-CM | POA: Insufficient documentation

## 2017-10-17 DIAGNOSIS — E860 Dehydration: Secondary | ICD-10-CM | POA: Insufficient documentation

## 2017-10-17 DIAGNOSIS — F17210 Nicotine dependence, cigarettes, uncomplicated: Secondary | ICD-10-CM | POA: Insufficient documentation

## 2017-10-17 DIAGNOSIS — I10 Essential (primary) hypertension: Secondary | ICD-10-CM | POA: Insufficient documentation

## 2017-10-17 LAB — CBC WITH DIFFERENTIAL/PLATELET
BASOS PCT: 0 %
Basophils Absolute: 0 10*3/uL (ref 0.0–0.1)
Eosinophils Absolute: 0.1 10*3/uL (ref 0.0–0.7)
Eosinophils Relative: 0 %
HEMATOCRIT: 35.9 % — AB (ref 36.0–46.0)
HEMOGLOBIN: 12.2 g/dL (ref 12.0–15.0)
LYMPHS PCT: 4 %
Lymphs Abs: 0.9 10*3/uL (ref 0.7–4.0)
MCH: 30.2 pg (ref 26.0–34.0)
MCHC: 34 g/dL (ref 30.0–36.0)
MCV: 88.9 fL (ref 78.0–100.0)
MONO ABS: 1.5 10*3/uL — AB (ref 0.1–1.0)
MONOS PCT: 6 %
NEUTROS ABS: 21.4 10*3/uL — AB (ref 1.7–7.7)
NEUTROS PCT: 90 %
Platelets: 268 10*3/uL (ref 150–400)
RBC: 4.04 MIL/uL (ref 3.87–5.11)
RDW: 12.9 % (ref 11.5–15.5)
WBC: 23.9 10*3/uL — ABNORMAL HIGH (ref 4.0–10.5)

## 2017-10-17 LAB — BASIC METABOLIC PANEL
Anion gap: 10 (ref 5–15)
BUN: 13 mg/dL (ref 6–20)
CALCIUM: 8.7 mg/dL — AB (ref 8.9–10.3)
CHLORIDE: 100 mmol/L — AB (ref 101–111)
CO2: 26 mmol/L (ref 22–32)
CREATININE: 0.69 mg/dL (ref 0.44–1.00)
GFR calc non Af Amer: >60 mL/min (ref >60)
GLUCOSE: 108 mg/dL — AB (ref 65–99)
Potassium: 3.6 mmol/L (ref 3.5–5.1)
Sodium: 136 mmol/L (ref 135–145)

## 2017-10-17 LAB — RAPID STREP SCREEN (MED CTR MEBANE ONLY): Streptococcus, Group A Screen (Direct): NEGATIVE

## 2017-10-17 MED ORDER — SODIUM CHLORIDE 0.9 % IV BOLUS (SEPSIS)
1000.0000 mL | Freq: Once | INTRAVENOUS | Status: AC
  Administered 2017-10-17: 1000 mL via INTRAVENOUS

## 2017-10-17 MED ORDER — DEXAMETHASONE SODIUM PHOSPHATE 10 MG/ML IJ SOLN
10.0000 mg | Freq: Once | INTRAMUSCULAR | Status: AC
  Administered 2017-10-17: 10 mg via INTRAVENOUS
  Filled 2017-10-17: qty 1

## 2017-10-17 MED ORDER — HYDROCODONE-ACETAMINOPHEN 7.5-325 MG/15ML PO SOLN: 15.0000 mL | Freq: Four times a day (QID) | ORAL | 0 refills | Status: DC | PRN

## 2017-10-17 MED ORDER — KETOROLAC TROMETHAMINE 30 MG/ML IJ SOLN
30.0000 mg | Freq: Once | INTRAMUSCULAR | Status: AC
  Administered 2017-10-17: 30 mg via INTRAVENOUS
  Filled 2017-10-17: qty 1

## 2017-10-17 MED ORDER — ONDANSETRON HCL 4 MG/2ML IJ SOLN
4.0000 mg | Freq: Once | INTRAMUSCULAR | Status: AC
  Administered 2017-10-17: 4 mg via INTRAVENOUS
  Filled 2017-10-17: qty 2

## 2017-10-17 MED ORDER — MORPHINE SULFATE (PF) 4 MG/ML IV SOLN
4.0000 mg | Freq: Once | INTRAVENOUS | Status: AC
  Administered 2017-10-17: 4 mg via INTRAVENOUS
  Filled 2017-10-17: qty 1

## 2017-10-17 MED ORDER — PROMETHAZINE HCL 25 MG PO TABS: 25.0000 mg | ORAL_TABLET | Freq: Four times a day (QID) | ORAL | 0 refills | Status: DC | PRN

## 2017-10-17 MED ORDER — CLINDAMYCIN PHOSPHATE 600 MG/50ML IV SOLN
600.0000 mg | Freq: Once | INTRAVENOUS | Status: AC
  Administered 2017-10-17: 600 mg via INTRAVENOUS
  Filled 2017-10-17: qty 50

## 2017-10-17 MED ORDER — CLINDAMYCIN HCL 150 MG PO CAPS: 300.0000 mg | ORAL_CAPSULE | Freq: Three times a day (TID) | ORAL | 0 refills | Status: DC

## 2017-10-17 NOTE — ED Notes (Signed)
Patient given a cup of water. Tolerated well so far.

## 2017-10-17 NOTE — ED Provider Notes (Signed)
[facility]-EMERGENCY DEPT Provider Note   CSN: [id number] Arrival date & time: 10/17/17  [id number]     History   Chief Complaint Chief Complaint  Patient presents with  . Sore Throat  . Emesis    HPI Kerry Richards is a 27 y.o. female.  HPI Kerry Richards is a 27 y.o. female with hx of htn, migraine headaches, presents to ED with complaint of sore throat and vomiting. Pt states her symptoms started 2 days ago. States family member has strep throat. States unable to keep anything down. States also difficulty swallowing, and  unable to eat or drink anything, although did say she took ibuprofen just prior to coming in and was able to keep it down. Swallowing own secretions. Reports fever at home up to 100. No cough. No congestion. No chest or abdominal pain. No urinary symptoms. No vaginal discharge or bleeding. States she is having a headache and feels generally weak.   Past Medical History:  Diagnosis Date  . Anxiety   . Hx of pyelonephritis   . Hypertension   . Migraine   . Trichomonas contact, treated     There are no active problems to display for this patient.   Past Surgical History:  Procedure Laterality Date  . CESAREAN SECTION  08/24/2012   Procedure: CESAREAN SECTION;  Surgeon: Yeates PilaKathy W Richardson, MD;  Location: [facility];  Service: Obstetrics;  Laterality: N/A;  Primary Cesarean Section Delivery Baby  Boy @ 0004, Apgars 9/9  . DILATION AND CURETTAGE OF UTERUS    . TOOTH EXTRACTION    . WISDOM TOOTH EXTRACTION      OB History    Gravida Para Term Preterm AB Living   3 1 1  0 2 1   SAB TAB Ectopic Multiple Live Births   1 1     1        Home Medications    Prior to Admission medications   Medication Sig Start Date End Date Taking? Authorizing Provider  aspirin-sod bicarb-citric acid (ALKA-SELTZER) 325 MG TBEF tablet Take 325 mg by mouth every 6 (six) hours as needed (upset stomach).   Yes [provider]  ibuprofen (ADVIL,MOTRIN) 200  MG tablet Take 400-600 mg by mouth every 6 (six) hours as needed for moderate pain.   Yes [provider]  ibuprofen (ADVIL,MOTRIN) 800 MG tablet Take 1 tablet (800 mg total) by mouth 3 (three) times daily. Patient not taking: Reported on 02/07/2016 11/29/15   Danelle Berryapia, Leisa, PA-C  magnesium citrate solution Take 296 mLs by mouth once. OTC Patient not taking: Reported on 10/17/2017 02/07/16   Barrett HenleNadeau, Nicole Elizabeth, PA-C  polyethylene glycol powder (GLYCOLAX/MIRALAX) powder Take 17 g by mouth 2 (two) times daily. Until daily soft stools  OTC Patient not taking: Reported on 10/17/2017 02/07/16   Barrett HenleNadeau, Nicole Elizabeth, PA-C  predniSONE (DELTASONE) 10 MG tablet Take 4 tablets (40 mg total) by mouth daily. Patient not taking: Reported on 02/07/2016 11/29/15   Danelle Berryapia, Leisa, PA-C  traMADol (ULTRAM) 50 MG tablet Take 1 tablet (50 mg total) by mouth every 12 (twelve) hours as needed for severe pain. Patient not taking: Reported on 02/07/2016 11/29/15   Danelle Berryapia, Leisa, PA-C    Family History Family History  Problem Relation Age of Onset  . Hypertension Father   . Migraines Father   . Stroke Father   . Arthritis Maternal Aunt   . Arthritis Maternal Grandmother   . Cancer Maternal Grandmother  lung  . Diabetes Paternal Grandfather   . Heart disease Paternal Grandfather   . Hypertension Paternal Grandfather   . Heart disease Paternal Aunt   . Anesthesia problems Neg Hx   . Other Neg Hx     Social History Social History   Tobacco Use  . Smoking status: Current Every Day Smoker    Packs/day: 0.50    Types: Cigarettes    Last attempt to quit: 09/30/2011    Years since quitting: 6.0  . Smokeless tobacco: Never Used  Substance Use Topics  . Alcohol use: Yes    Alcohol/week: 0.0 oz  . Drug use: No     Allergies   Metronidazole; Shellfish allergy; and Amitriptyline   Review of Systems Review of Systems  Constitutional: Positive for chills and fever.  HENT: Positive for ear  pain, sore throat and trouble swallowing. Negative for congestion and rhinorrhea.   Respiratory: Negative for cough, chest tightness and shortness of breath.   Cardiovascular: Negative for chest pain, palpitations and leg swelling.  Gastrointestinal: Positive for nausea and vomiting. Negative for abdominal pain and diarrhea.  Genitourinary: Negative for dysuria, flank pain, pelvic pain, vaginal bleeding, vaginal discharge and vaginal pain.  Musculoskeletal: Negative for arthralgias, myalgias, neck pain and neck stiffness.  Skin: Negative for rash.  Neurological: Positive for dizziness, weakness, light-headedness and headaches.  All other systems reviewed and are negative.    Physical Exam Updated Vital Signs BP (!) 137/100 (BP Location: Left Arm)   Pulse (!) 133   Temp 99.6 F (37.6 C) (Oral)   Resp (!) 28   LMP 09/22/2017   SpO2 98%   Physical Exam  Constitutional: She is oriented to person, place, and time. She appears well-developed and well-nourished. She appears ill.  HENT:  Head: Normocephalic.  Right Ear: Tympanic membrane, external ear and ear canal normal.  Left Ear: Tympanic membrane, external ear and ear canal normal.  Nose: Nose normal.  Tonsils bilaterally enlarged, almost touching, erythematous, with white exudate. Uvula midline.   Eyes: Conjunctivae are normal.  Neck: Neck supple.  Cardiovascular: Normal rate, regular rhythm and normal heart sounds.  Pulmonary/Chest: Effort normal and breath sounds normal. No respiratory distress. She has no wheezes. She has no rales.  Abdominal: Soft. Bowel sounds are normal. She exhibits no distension. There is no tenderness. There is no rebound.  Musculoskeletal: She exhibits no edema.  Neurological: She is alert and oriented to person, place, and time.  Skin: Skin is warm and dry.  Psychiatric: She has a normal mood and affect. Her behavior is normal.  Nursing note and vitals reviewed.    ED Treatments / Results   Labs (all labs ordered are listed, but only abnormal results are displayed) Labs Reviewed  CBC WITH DIFFERENTIAL/PLATELET - Abnormal; Notable for the following components:      Result Value   WBC 23.9 (*)    HCT 35.9 (*)    Neutro Abs 21.4 (*)    Monocytes Absolute 1.5 (*)    All other components within normal limits  BASIC METABOLIC PANEL - Abnormal; Notable for the following components:   Chloride 100 (*)    Glucose, Bld 108 (*)    Calcium 8.7 (*)    All other components within normal limits  RAPID STREP SCREEN (NOT AT [facility])  CULTURE, GROUP A STREP ([facility])  I-STAT BETA HCG BLOOD, ED ([facility], [facility], [facility] ONLY)    EKG  EKG Interpretation None       Radiology No results  found.  Procedures Procedures (including critical care time)  Medications Ordered in ED Medications  sodium chloride 0.9 % bolus 1,000 mL (not administered)  ondansetron (ZOFRAN) injection 4 mg (not administered)  dexamethasone (DECADRON) injection 10 mg (not administered)  morphine 4 MG/ML injection 4 mg (not administered)  sodium chloride 0.9 % bolus 1,000 mL (not administered)     Initial Impression / Assessment and Plan / ED Course  I have reviewed the triage vital signs and the nursing notes.  Pertinent labs & imaging results that were available during my care of the patient were reviewed by me and considered in my medical decision making (see chart for details).     Patient with sore throat, difficulty swallowing, she appears dry on exam, very uncomfortable.  Exam is not concerning for peritonsillar abscess, however there is significant amount of swelling.  Will give steroids, pain medications, IV fluids, antiemetics.  Will get basic labs to assess status of her dehydration.  Her heart rate is in 130s.  1:58 PM Rapid strep is negative, however given significant swelling with exudate, covered with clindamycin.  White blood cell count is 23.9.  Patient hydrated with IV fluids.  Her heart rate and vital  signs improved with fluids and medications.  She states she is feeling much better and is drinking water with no difficulty.  Plan to discharge home, will treat with NSAIDs, lortab, clindamycin. Return precautions discussed.    Vitals:   10/17/17 0955 10/17/17 1254 10/17/17 1300  BP: (!) 137/100 (!) 147/84 135/85  Pulse: (!) 133 98 86  Resp: (!) 28 16   Temp: 99.6 F (37.6 C)    TempSrc: Oral    SpO2: 98% 96% 94%     Final Clinical Impressions(s) / ED Diagnoses   Final diagnoses:  Pharyngitis, unspecified etiology  Dehydration    ED Discharge Orders        Ordered    promethazine (PHENERGAN) 25 MG tablet  Every 6 hours PRN     10/17/17 1419    HYDROcodone-acetaminophen (HYCET) 7.5-325 mg/15 ml solution  4 times daily PRN     10/17/17 1419    clindamycin (CLEOCIN) 150 MG capsule  3 times daily     10/17/17 1419       Jaynie Crumble, PA-C 10/17/17 1539    Cathren Laine, MD 10/17/17 1544

## 2017-10-17 NOTE — Discharge Instructions (Signed)
Drink plenty of fluids.  Tylenol or Motrin for pain.  Lortab for severe pain only.  Phenergan for nausea and vomiting.  Continue antibiotic until all gone.  Follow-up with a family doctor for recheck.  Return if worsening

## 2017-10-17 NOTE — ED Notes (Signed)
Patients Istat was NEG. Print out shown to PA and RN. Result not showing in Epic.

## 2017-10-17 NOTE — ED Triage Notes (Signed)
Patient c/o sore throat and n/v x 3 days.

## 2017-10-19 ENCOUNTER — Emergency Department (HOSPITAL_COMMUNITY): Payer: Self-pay

## 2017-10-19 ENCOUNTER — Encounter (HOSPITAL_COMMUNITY): Payer: Self-pay | Admitting: Emergency Medicine

## 2017-10-19 ENCOUNTER — Emergency Department (HOSPITAL_COMMUNITY)
Admission: EM | Admit: 2017-10-19 | Discharge: 2017-10-19 | Discharge status: Against Medical Advice | Payer: Self-pay | Attending: Emergency Medicine | Admitting: Emergency Medicine

## 2017-10-19 DIAGNOSIS — Z5321 Procedure and treatment not carried out due to patient leaving prior to being seen by health care provider: Secondary | ICD-10-CM | POA: Insufficient documentation

## 2017-10-19 LAB — CULTURE, GROUP A STREP (THRC)

## 2017-10-19 NOTE — ED Triage Notes (Signed)
Patient here from home with complaints of chest pain, palpitations, and SOB. Reports that she woke up the the middle of the night with palpitations. No nausea/vomiiting. Central chest pain radiating over into left chest.

## 2017-10-19 NOTE — ED Notes (Signed)
Pt returned stickers to registration and said she was leaving

## 2018-04-13 ENCOUNTER — Ambulatory Visit (INDEPENDENT_AMBULATORY_CARE_PROVIDER_SITE_OTHER): Payer: Self-pay | Admitting: Adult Health

## 2018-04-13 ENCOUNTER — Encounter: Payer: Self-pay | Admitting: Adult Health

## 2018-04-13 VITALS — BP 133/85 | HR 101 | Ht 63.25 in | Wt 220.9 lb

## 2018-04-13 DIAGNOSIS — Z0000 Encounter for general adult medical examination without abnormal findings: Secondary | ICD-10-CM

## 2018-04-13 DIAGNOSIS — F419 Anxiety disorder, unspecified: Secondary | ICD-10-CM | POA: Insufficient documentation

## 2018-04-13 DIAGNOSIS — R21 Rash and other nonspecific skin eruption: Secondary | ICD-10-CM | POA: Insufficient documentation

## 2018-04-13 DIAGNOSIS — Z0001 Encounter for general adult medical examination with abnormal findings: Secondary | ICD-10-CM | POA: Insufficient documentation

## 2018-04-13 DIAGNOSIS — Z01419 Encounter for gynecological examination (general) (routine) without abnormal findings: Secondary | ICD-10-CM

## 2018-04-13 MED ORDER — BETAMETHASONE VALERATE 0.1 % EX OINT: 1.0000 "application " | TOPICAL_OINTMENT | Freq: Two times a day (BID) | CUTANEOUS | 1 refills | Status: DC

## 2018-04-13 MED ORDER — LORAZEPAM 0.5 MG PO TABS: 0.5000 mg | ORAL_TABLET | Freq: Every day | ORAL | 0 refills | Status: DC

## 2018-04-13 MED ORDER — SERTRALINE HCL 50 MG PO TABS: ORAL_TABLET | ORAL | 0 refills | Status: DC

## 2018-04-13 NOTE — Progress Notes (Signed)
Subjective:    Patient ID: Kerry Richards, female    DOB: Jan 19, 1991, 27 y.o.   MRN: [id number]  HPI:  Kerry Richards is here to establish as a new pt.  She is a pleasant 27 year old female.   PMH: HTN, Anxiety, Migraine with aura She estimates to have migraine once every 2 weeks and daily tension HAs She reports high level of stress and anxiety, r/t the father of her 85 year old son leaving after 2 days after his first birthday. She lives with her mother and sister and reports strong support system of friends and local family. She has not had regular healthcare since highschool She has never been treated for anxiety/depression She estimates to drink 30-40 oz plain water/day She also drinks several Gatorades and Mt Dew/day She estimates to smoke pack cigarettes every 3 days She will drink ETOH once every few weeks, however she will drink 1/2- full pint of liquor when she does She denies ETOH causing interpersonal relationships/work responsibilities  She denies hx of DUI Depression screen [facility] 2/9 04/13/2018  Decreased Interest 0  Down, Depressed, Hopeless 0  PHQ - 2 Score 0  Altered sleeping 0  Tired, decreased energy 2  Change in appetite 2  Feeling bad or failure about yourself  0  Trouble concentrating 0  Moving slowly or fidgety/restless 0  Suicidal thoughts 0  PHQ-9 Score 4  Difficult doing work/chores Somewhat difficult    Patient Care Team    Relationship Specialty Notifications Start End  Danford, Jinny Blossom, NP PCP - General Family Medicine  09/26/17     Patient Active Problem List   Diagnosis Date Noted  . Healthcare maintenance 04/13/2018  . Rash 04/13/2018  . Anxiety 04/13/2018     Past Medical History:  Diagnosis Date  . Anxiety   . Hx of pyelonephritis   . Hypertension   . Migraine   . Trichomonas contact, treated      Past Surgical History:  Procedure Laterality Date  . CESAREAN SECTION  08/24/2012   Procedure: CESAREAN SECTION;  Surgeon: Digilio Pila, MD;  Location: [facility];  Service: Obstetrics;  Laterality: N/A;  Primary Cesarean Section Delivery Baby  Boy @ 0004, Apgars 9/9  . DILATION AND CURETTAGE OF UTERUS    . TOOTH EXTRACTION    . WISDOM TOOTH EXTRACTION       Family History  Problem Relation Age of Onset  . Hypertension Father   . Migraines Father   . Stroke Father   . Arthritis Maternal Aunt   . Hypertension Maternal Aunt   . Arthritis Maternal Grandmother   . Cancer Maternal Grandmother        lung  . Diabetes Paternal Grandfather   . Heart disease Paternal Grandfather   . Hypertension Paternal Grandfather   . Heart attack Paternal Grandfather   . Heart disease Paternal Aunt   . Heart attack Paternal Aunt   . Depression Paternal Uncle   . Suicidality Paternal Uncle   . Anesthesia problems Neg Hx   . Other Neg Hx      Social History   Substance and Sexual Activity  Drug Use Yes  . Types: Marijuana     Social History   Substance and Sexual Activity  Alcohol Use Yes  . Alcohol/week: 1.8 oz  . Types: 3 Shots of liquor per week     Social History   Tobacco Use  Smoking Status Current Every Day Smoker  . Packs/day: 0.50  .  Years: 14.00  . Pack years: 7.00  . Types: Cigarettes  . Last attempt to quit: 09/30/2011  . Years since quitting: 6.5  Smokeless Tobacco Never Used     Outpatient Encounter Medications as of 04/13/2018  Medication Sig  . betamethasone valerate ointment (VALISONE) 0.1 % Apply 1 application topically 2 (two) times daily.  Marland Kitchen LORazepam (ATIVAN) 0.5 MG tablet Take 1 tablet (0.5 mg total) by mouth at bedtime. As needed.  . sertraline (ZOLOFT) 50 MG tablet 1/2 tablet daily for one week then increase to one full tablet daily  . [DISCONTINUED] aspirin-sod bicarb-citric acid (ALKA-SELTZER) 325 MG TBEF tablet Take 325 mg by mouth every 6 (six) hours as needed (upset stomach).  . [DISCONTINUED] clindamycin (CLEOCIN) 150 MG capsule Take 2 capsules (300 mg total) by mouth 3  (three) times daily.  . [DISCONTINUED] HYDROcodone-acetaminophen (HYCET) 7.5-325 mg/15 ml solution Take 15 mLs by mouth 4 (four) times daily as needed for moderate pain.  . [DISCONTINUED] ibuprofen (ADVIL,MOTRIN) 200 MG tablet Take 400-600 mg by mouth every 6 (six) hours as needed for moderate pain.  . [DISCONTINUED] ibuprofen (ADVIL,MOTRIN) 800 MG tablet Take 1 tablet (800 mg total) by mouth 3 (three) times daily. (Patient not taking: Reported on 02/07/2016)  . [DISCONTINUED] magnesium citrate solution Take 296 mLs by mouth once. OTC (Patient not taking: Reported on 10/17/2017)  . [DISCONTINUED] polyethylene glycol powder (GLYCOLAX/MIRALAX) powder Take 17 g by mouth 2 (two) times daily. Until daily soft stools  OTC (Patient not taking: Reported on 10/17/2017)  . [DISCONTINUED] predniSONE (DELTASONE) 10 MG tablet Take 4 tablets (40 mg total) by mouth daily. (Patient not taking: Reported on 02/07/2016)  . [DISCONTINUED] promethazine (PHENERGAN) 25 MG tablet Take 1 tablet (25 mg total) by mouth every 6 (six) hours as needed for nausea or vomiting.  . [DISCONTINUED] traMADol (ULTRAM) 50 MG tablet Take 1 tablet (50 mg total) by mouth every 12 (twelve) hours as needed for severe pain. (Patient not taking: Reported on 02/07/2016)   No facility-administered encounter medications on file as of 04/13/2018.     Allergies: Metronidazole; Shellfish allergy; and Amitriptyline  Body mass index is 38.82 kg/m.  Blood pressure 133/85, pulse (!) 101, height 5' 3.25" (1.607 m), weight 220 lb 14.4 oz (100.2 kg), last menstrual period 03/15/2018, SpO2 97 %.  Review of Systems  Constitutional: Positive for fatigue. Negative for activity change, appetite change, chills, diaphoresis, fever and unexpected weight change.  Eyes: Negative for visual disturbance.  Respiratory: Negative for cough, chest tightness, shortness of breath, wheezing and stridor.   Cardiovascular: Negative for chest pain, palpitations and leg  swelling.  Gastrointestinal: Negative for abdominal distention, anal bleeding, blood in stool, constipation, diarrhea, nausea and vomiting.  Endocrine: Negative for cold intolerance, heat intolerance, polydipsia, polyphagia and polyuria.  Genitourinary: Negative for difficulty urinating and flank pain.  Musculoskeletal: Negative for arthralgias, back pain, gait problem, joint swelling, myalgias, neck pain and neck stiffness.  Skin: Positive for rash. Negative for color change, pallor and wound.  Neurological: Positive for headaches. Negative for dizziness.  Hematological: Does not bruise/bleed easily.  Psychiatric/Behavioral: Positive for dysphoric mood and sleep disturbance. Negative for behavioral problems, confusion, decreased concentration, hallucinations, self-injury and suicidal ideas. The patient is nervous/anxious. The patient is not hyperactive.        Objective:   Physical Exam  Constitutional: She appears well-developed and well-nourished. No distress.  HENT:  Head: Normocephalic and atraumatic.  Right Ear: External ear normal.  Left Ear: External ear normal.  Nose:  Nose normal.  Mouth/Throat: Oropharynx is clear and moist.  Cardiovascular: Regular rhythm, normal heart sounds and intact distal pulses. Tachycardia present.  Pulmonary/Chest: Effort normal and breath sounds normal. No stridor. No respiratory distress. She has no wheezes. She has no rales. She exhibits no tenderness.  Neurological: She is alert.  Skin: Skin is warm and dry. Capillary refill takes less than 2 seconds. Rash noted. She is not diaphoretic. There is erythema. No pallor.     Psychiatric: She has a normal mood and affect. Her behavior is normal. Judgment and thought content normal.  Nursing note and vitals reviewed.         Assessment & Plan:   1. Well woman exam with routine gynecological exam   2. Healthcare maintenance   3. Rash   4. Anxiety     Healthcare maintenance Increase water  intake, strive for at least 75 ounces/day.   Follow Heart Healthy diet Increase regular exercise.  Recommend at least 30 minutes daily, 5 days per week of walking, jogging, biking, swimming, YouTube/Pinterest workout videos. Please start Sertraline 50mg - 1/2 tablet daily for one week then increase to one tablet daily. Please use Lorazepam 0.5mg  sparingly, as needed at bedtime to help reduce anxiety and promote sleep. Please send MyChart message in 2-3 weeks and let me know how you are feeling on Sertraline (Zoloft). Recommend yoga practice. Recommend eating small meals throughout the day. Recommend complete physical with fasting labs in 3 days.  Rash Betamethasone 0.1% cream Apply to bil hands  Anxiety [facility] 2/[facility] 04/13/2018  Decreased Interest 0  Down, Depressed, Hopeless 0  PHQ - 2 Score 0  Altered sleeping 0  Tired, decreased energy 2  Change in appetite 2  Feeling bad or failure about yourself  0  Trouble concentrating 0  Moving slowly or fidgety/restless 0  Suicidal thoughts 0  PHQ-9 Score 4  Difficult doing work/chores Somewhat difficult     FOLLOW-UP:  Return in about 3 months (around 07/14/2018) for Fasting Labs, CPE.

## 2018-04-13 NOTE — Patient Instructions (Addendum)
Mediterranean Diet A Mediterranean diet refers to food and lifestyle choices that are based on the traditions of countries located on the [place]. This way of eating has been shown to help prevent certain conditions and improve outcomes for people who have chronic diseases, like kidney disease and heart disease. What are tips for following this plan? Lifestyle  Cook and eat meals together with your family, when possible.  Drink enough fluid to keep your urine clear or pale yellow.  Be physically active every day. This includes: ? Aerobic exercise like running or swimming. ? Leisure activities like gardening, walking, or housework.  Get 7-8 hours of sleep each night.  If recommended by your health care provider, drink red wine in moderation. This means 1 glass a day for nonpregnant women and 2 glasses a day for men. A glass of wine equals 5 oz (150 mL). Reading food labels  Check the serving size of packaged foods. For foods such as rice and pasta, the serving size refers to the amount of cooked product, not dry.  Check the total fat in packaged foods. Avoid foods that have saturated fat or trans fats.  Check the ingredients list for added sugars, such as corn syrup. Shopping  At the grocery store, buy most of your food from the areas near the walls of the store. This includes: ? Fresh fruits and vegetables (produce). ? Grains, beans, nuts, and seeds. Some of these may be available in unpackaged forms or large amounts (in bulk). ? Fresh seafood. ? Poultry and eggs. ? Low-fat dairy products.  Buy whole ingredients instead of prepackaged foods.  Buy fresh fruits and vegetables in-season from local farmers markets.  Buy frozen fruits and vegetables in resealable bags.  If you do not have access to quality fresh seafood, buy precooked frozen shrimp or canned fish, such as tuna, salmon, or sardines.  Buy small amounts of raw or cooked vegetables, salads, or olives from the  deli or salad bar at your store.  Stock your pantry so you always have certain foods on hand, such as olive oil, canned tuna, canned tomatoes, rice, pasta, and beans. Cooking  Cook foods with extra-virgin olive oil instead of using butter or other vegetable oils.  Have meat as a side dish, and have vegetables or grains as your main dish. This means having meat in small portions or adding small amounts of meat to foods like pasta or stew.  Use beans or vegetables instead of meat in common dishes like chili or lasagna.  Experiment with different cooking methods. Try roasting or broiling vegetables instead of steaming or sauteing them.  Add frozen vegetables to soups, stews, pasta, or rice.  Add nuts or seeds for added healthy fat at each meal. You can add these to yogurt, salads, or vegetable dishes.  Marinate fish or vegetables using olive oil, lemon juice, garlic, and fresh herbs. Meal planning  Plan to eat 1 vegetarian meal one day each week. Try to work up to 2 vegetarian meals, if possible.  Eat seafood 2 or more times a week.  Have healthy snacks readily available, such as: ? Vegetable sticks with hummus. ? Greek yogurt. ? Fruit and nut trail mix.  Eat balanced meals throughout the week. This includes: ? Fruit: 2-3 servings a day ? Vegetables: 4-5 servings a day ? Low-fat dairy: 2 servings a day ? Fish, poultry, or lean meat: 1 serving a day ? Beans and legumes: 2 or more servings a week ? Nuts   and seeds: 1-2 servings a day ? Whole grains: 6-8 servings a day ? Extra-virgin olive oil: 3-4 servings a day  Limit red meat and sweets to only a few servings a month What are my food choices?  Mediterranean diet ? Recommended ? Grains: Whole-grain pasta. Brown rice. Bulgar wheat. Polenta. Couscous. Whole-wheat bread. [place]. ? Vegetables: Artichokes. Beets. Broccoli. Cabbage. Carrots. Eggplant. Green beans. Chard. Kale. Spinach. Onions. Leeks. Peas. Squash.  Tomatoes. Peppers. Radishes. ? Fruits: Apples. Apricots. Avocado. Berries. Bananas. Cherries. Dates. Figs. Grapes. Lemons. Melon. Oranges. Peaches. Plums. Pomegranate. ? Meats and other protein foods: Beans. Almonds. Sunflower seeds. Pine nuts. Peanuts. [place]. Salmon. Scallops. Shrimp. [place]. Tilapia. Clams. Oysters. Eggs. ? Dairy: Low-fat milk. Cheese. Greek yogurt. ? Beverages: Water. Red wine. Herbal tea. ? Fats and oils: Extra virgin olive oil. Avocado oil. Grape seed oil. ? Sweets and desserts: [place] yogurt with honey. Baked apples. Poached pears. Trail mix. ? Seasoning and other foods: Basil. Cilantro. Coriander. Cumin. Mint. Parsley. Sage. Rosemary. Tarragon. Garlic. Oregano. Thyme. Pepper. Balsalmic vinegar. Tahini. Hummus. Tomato sauce. Olives. Mushrooms. ? Limit these ? Grains: Prepackaged pasta or rice dishes. Prepackaged cereal with added sugar. ? Vegetables: Deep fried potatoes (french fries). ? Fruits: Fruit canned in syrup. ? Meats and other protein foods: Beef. Pork. Lamb. Poultry with skin. Hot dogs. [place] Salines. ? Dairy: Ice cream. Sour cream. Whole milk. ? Beverages: Juice. Sugar-sweetened soft drinks. Beer. Liquor and spirits. ? Fats and oils: Butter. Canola oil. Vegetable oil. Beef fat (tallow). Lard. ? Sweets and desserts: Cookies. Cakes. Pies. Candy. ? Seasoning and other foods: Mayonnaise. Premade sauces and marinades. ? The items listed may not be a complete list. Talk with your dietitian about what dietary choices are right for you. Summary  The Mediterranean diet includes both food and lifestyle choices.  Eat a variety of fresh fruits and vegetables, beans, nuts, seeds, and whole grains.  Limit the amount of red meat and sweets that you eat.  Talk with your health care provider about whether it is safe for you to drink red wine in moderation. This means 1 glass a day for nonpregnant women and 2 glasses a day for men. A glass of wine equals 5 oz (150 mL). This information  is not intended to replace advice given to you by your health care provider. Make sure you discuss any questions you have with your health care provider. Document Released: 04/22/2016 Document Revised: 05/25/2016 Document Reviewed: 04/22/2016 Elsevier Interactive Patient Education  2018 [employer].   Generalized Anxiety Disorder, Adult Generalized anxiety disorder (GAD) is a mental health disorder. People with this condition constantly worry about everyday events. Unlike normal anxiety, worry related to GAD is not triggered by a specific event. These worries also do not fade or get better with time. GAD interferes with life functions, including relationships, work, and school. GAD can vary from mild to severe. People with severe GAD can have intense waves of anxiety with physical symptoms (panic attacks). What are the causes? The exact cause of GAD is not known. What increases the risk? This condition is more likely to develop in:  Women.  People who have a family history of anxiety disorders.  People who are very shy.  People who experience very stressful life events, such as the death of a loved one.  People who have a very stressful family environment.  What are the signs or symptoms? People with GAD often worry excessively about many things in their lives, such as their health  and family. They may also be overly concerned about:  Doing well at work.  Being on time.  Natural disasters.  Friendships.  Physical symptoms of GAD include:  Fatigue.  Muscle tension or having muscle twitches.  Trembling or feeling shaky.  Being easily startled.  Feeling like your heart is pounding or racing.  Feeling out of breath or like you cannot take a deep breath.  Having trouble falling asleep or staying asleep.  Sweating.  Nausea, diarrhea, or irritable bowel syndrome (IBS).  Headaches.  Trouble concentrating or remembering facts.  Restlessness.  Irritability.  How  is this diagnosed? Your health care provider can diagnose GAD based on your symptoms and medical history. You will also have a physical exam. The health care provider will ask specific questions about your symptoms, including how severe they are, when they started, and if they come and go. Your health care provider may ask you about your use of alcohol or drugs, including prescription medicines. Your health care provider may refer you to a mental health specialist for further evaluation. Your health care provider will do a thorough examination and may perform additional tests to rule out other possible causes of your symptoms. To be diagnosed with GAD, a person must have anxiety that:  Is out of his or her control.  Affects several different aspects of his or her life, such as work and relationships.  Causes distress that makes him or her unable to take part in normal activities.  Includes at least three physical symptoms of GAD, such as restlessness, fatigue, trouble concentrating, irritability, muscle tension, or sleep problems.  Before your health care provider can confirm a diagnosis of GAD, these symptoms must be present more days than they are not, and they must last for six months or longer. How is this treated? The following therapies are usually used to treat GAD:  Medicine. Antidepressant medicine is usually prescribed for long-term daily control. Antianxiety medicines may be added in severe cases, especially when panic attacks occur.  Talk therapy (psychotherapy). Certain types of talk therapy can be helpful in treating GAD by providing support, education, and guidance. Options include: ? Cognitive behavioral therapy (CBT). People learn coping skills and techniques to ease their anxiety. They learn to identify unrealistic or negative thoughts and behaviors and to replace them with positive ones. ? Acceptance and commitment therapy (ACT). This treatment teaches people how to be mindful  as a way to cope with unwanted thoughts and feelings. ? Biofeedback. This process trains you to manage your body's response (physiological response) through breathing techniques and relaxation methods. You will work with a therapist while machines are used to monitor your physical symptoms.  Stress management techniques. These include yoga, meditation, and exercise.  A mental health specialist can help determine which treatment is best for you. Some people see improvement with one type of therapy. However, other people require a combination of therapies. Follow these instructions at home:  Take over-the-counter and prescription medicines only as told by your health care provider.  Try to maintain a normal routine.  Try to anticipate stressful situations and allow extra time to manage them.  Practice any stress management or self-calming techniques as taught by your health care provider.  Do not punish yourself for setbacks or for not making progress.  Try to recognize your accomplishments, even if they are small.  Keep all follow-up visits as told by your health care provider. This is important. Contact a health care provider if:  Your  symptoms do not get better.  Your symptoms get worse.  You have signs of depression, such as: ? A persistently sad, cranky, or irritable mood. ? Loss of enjoyment in activities that used to bring you joy. ? Change in weight or eating. ? Changes in sleeping habits. ? Avoiding friends or family members. ? Loss of energy for normal tasks. ? Feelings of guilt or worthlessness. Get help right away if:  You have serious thoughts about hurting yourself or others. If you ever feel like you may hurt yourself or others, or have thoughts about taking your own life, get help right away. You can go to your nearest emergency department or call:  Your local emergency services (911 in the [place]  A suicide crisis helpline, such as the National Suicide  Prevention Lifeline at 320-663-88731-587-102-4523. This is open 24 hours a day.  Summary  Generalized anxiety disorder (GAD) is a mental health disorder that involves worry that is not triggered by a specific event.  People with GAD often worry excessively about many things in their lives, such as their health and family.  GAD may cause physical symptoms such as restlessness, trouble concentrating, sleep problems, frequent sweating, nausea, diarrhea, headaches, and trembling or muscle twitching.  A mental health specialist can help determine which treatment is best for you. Some people see improvement with one type of therapy. However, other people require a combination of therapies. This information is not intended to replace advice given to you by your health care provider. Make sure you discuss any questions you have with your health care provider. Document Released: 12/25/2012 Document Revised: 07/20/2016 Document Reviewed: 07/20/2016 Elsevier Interactive Patient Education  2018 [employer]  Increase water intake, strive for at least 75 ounces/day.   Follow Heart Healthy diet Increase regular exercise.  Recommend at least 30 minutes daily, 5 days per week of walking, jogging, biking, swimming, YouTube/Pinterest workout videos. Please start Sertraline 50mg - 1/2 tablet daily for one week then increase to one tablet daily. Please use Lorazepam 0.5mg  sparingly, as needed at bedtime to help reduce anxiety and promote sleep. Please send MyChart message in 2-3 weeks and let me know how you are feeling on Sertraline (Zoloft). Recommend yoga practice. Recommend eating small meals throughout the day. Recommend complete physical with fasting labs in 3 days. WELCOME TO THE PRACTICE!

## 2018-04-13 NOTE — Assessment & Plan Note (Signed)
Depression screen PHQ 2/9 04/13/2018  Decreased Interest 0  Down, Depressed, Hopeless 0  PHQ - 2 Score 0  Altered sleeping 0  Tired, decreased energy 2  Change in appetite 2  Feeling bad or failure about yourself  0  Trouble concentrating 0  Moving slowly or fidgety/restless 0  Suicidal thoughts 0  PHQ-9 Score 4  Difficult doing work/chores Somewhat difficult

## 2018-04-13 NOTE — Assessment & Plan Note (Signed)
Increase water intake, strive for at least 75 ounces/day.   Follow Heart Healthy diet Increase regular exercise.  Recommend at least 30 minutes daily, 5 days per week of walking, jogging, biking, swimming, YouTube/Pinterest workout videos. Please start Sertraline 50mg - 1/2 tablet daily for one week then increase to one tablet daily. Please use Lorazepam 0.5mg  sparingly, as needed at bedtime to help reduce anxiety and promote sleep. Please send MyChart message in 2-3 weeks and let me know how you are feeling on Sertraline (Zoloft). Recommend yoga practice. Recommend eating small meals throughout the day. Recommend complete physical with fasting labs in 3 days.

## 2018-04-13 NOTE — Assessment & Plan Note (Signed)
Betamethasone 0.1% cream Apply to bil hands

## 2018-05-17 ENCOUNTER — Encounter: Payer: Medicaid Other | Admitting: Advanced Practice Midwife

## 2018-07-19 ENCOUNTER — Other Ambulatory Visit: Payer: Medicaid Other

## 2018-07-19 ENCOUNTER — Other Ambulatory Visit: Payer: Self-pay

## 2018-07-19 DIAGNOSIS — Z0000 Encounter for general adult medical examination without abnormal findings: Secondary | ICD-10-CM

## 2018-07-19 NOTE — Progress Notes (Deleted)
Subjective:    Patient ID: Kerry Richards, female    DOB: 08/21/1991, 27 y.o.   MRN: [id number]  HPI:04/13/18 OV:   Kerry Richards is here to establish as a new pt.  She is a pleasant 27 year old female.   PMH: HTN, Anxiety, Migraine with aura She estimates to have migraine once every 2 weeks and daily tension HAs She reports high level of stress and anxiety, r/t the father of her 52 year old son leaving after 2 days after his first birthday. She lives with her mother and sister and reports strong support system of friends and local family. She has not had regular healthcare since highschool She has never been treated for anxiety/depression She estimates to drink 30-40 oz plain water/day She also drinks several Gatorades and Mt Dew/day She estimates to smoke pack cigarettes every 3 days She will drink ETOH once every few weeks, however she will drink 1/2- full pint of liquor when she does She denies ETOH causing interpersonal relationships/work responsibilities  She denies hx of DUI  07/20/18 OV: Kerry Richards is here for CPE  Healthcare Maintenance: PAP- Immunizations-  Patient Care Team    Relationship Specialty Notifications Start End  Julaine Fusi, NP PCP - General Family Medicine  09/26/17     Patient Active Problem List   Diagnosis Date Noted  . Healthcare maintenance 04/13/2018  . Rash 04/13/2018  . Anxiety 04/13/2018     Past Medical History:  Diagnosis Date  . Anxiety   . Hx of pyelonephritis   . Hypertension   . Migraine   . Trichomonas contact, treated      Past Surgical History:  Procedure Laterality Date  . CESAREAN SECTION  08/24/2012   Procedure: CESAREAN SECTION;  Surgeon: Toledo Pila, MD;  Location: [facility];  Service: Obstetrics;  Laterality: N/A;  Primary Cesarean Section Delivery Baby  Boy @ 0004, Apgars 9/9  . DILATION AND CURETTAGE OF UTERUS    . TOOTH EXTRACTION    . WISDOM TOOTH EXTRACTION       Family History  Problem Relation Age  of Onset  . Hypertension Father   . Migraines Father   . Stroke Father   . Arthritis Maternal Aunt   . Hypertension Maternal Aunt   . Arthritis Maternal Grandmother   . Cancer Maternal Grandmother        lung  . Diabetes Paternal Grandfather   . Heart disease Paternal Grandfather   . Hypertension Paternal Grandfather   . Heart attack Paternal Grandfather   . Heart disease Paternal Aunt   . Heart attack Paternal Aunt   . Depression Paternal Uncle   . Suicidality Paternal Uncle   . Anesthesia problems Neg Hx   . Other Neg Hx      Social History   Substance and Sexual Activity  Drug Use Yes  . Types: Marijuana     Social History   Substance and Sexual Activity  Alcohol Use Yes  . Alcohol/week: 3.0 standard drinks  . Types: 3 Shots of liquor per week     Social History   Tobacco Use  Smoking Status Current Every Day Smoker  . Packs/day: 0.50  . Years: 14.00  . Pack years: 7.00  . Types: Cigarettes  . Last attempt to quit: 09/30/2011  . Years since quitting: 6.8  Smokeless Tobacco Never Used     Outpatient Encounter Medications as of 07/20/2018  Medication Sig  . betamethasone valerate ointment (VALISONE) 0.1 % Apply  1 application topically 2 (two) times daily.  Marland Kitchen LORazepam (ATIVAN) 0.5 MG tablet Take 1 tablet (0.5 mg total) by mouth at bedtime. As needed.  . sertraline (ZOLOFT) 50 MG tablet 1/2 tablet daily for one week then increase to one full tablet daily   No facility-administered encounter medications on file as of 07/20/2018.     Allergies: Metronidazole; Shellfish allergy; and Amitriptyline  There is no height or weight on file to calculate BMI.  There were no vitals taken for this visit.  Review of Systems  Constitutional: Positive for fatigue. Negative for activity change, appetite change, chills, diaphoresis, fever and unexpected weight change.  Eyes: Negative for visual disturbance.  Respiratory: Negative for cough, chest tightness,  shortness of breath, wheezing and stridor.   Cardiovascular: Negative for chest pain, palpitations and leg swelling.  Gastrointestinal: Negative for abdominal distention, anal bleeding, blood in stool, constipation, diarrhea, nausea and vomiting.  Endocrine: Negative for cold intolerance, heat intolerance, polydipsia, polyphagia and polyuria.  Genitourinary: Negative for difficulty urinating and flank pain.  Musculoskeletal: Negative for arthralgias, back pain, gait problem, joint swelling, myalgias, neck pain and neck stiffness.  Skin: Positive for rash. Negative for color change, pallor and wound.  Neurological: Positive for headaches. Negative for dizziness.  Hematological: Does not bruise/bleed easily.  Psychiatric/Behavioral: Positive for dysphoric mood and sleep disturbance. Negative for behavioral problems, confusion, decreased concentration, hallucinations, self-injury and suicidal ideas. The patient is nervous/anxious. The patient is not hyperactive.        Objective:   Physical Exam  Constitutional: She appears well-developed and well-nourished. No distress.  HENT:  Head: Normocephalic and atraumatic.  Right Ear: External ear normal.  Left Ear: External ear normal.  Nose: Nose normal.  Mouth/Throat: Oropharynx is clear and moist.  Cardiovascular: Regular rhythm, normal heart sounds and intact distal pulses. Tachycardia present.  Pulmonary/Chest: Effort normal and breath sounds normal. No stridor. No respiratory distress. She has no wheezes. She has no rales. She exhibits no tenderness.  Neurological: She is alert.  Skin: Skin is warm and dry. Capillary refill takes less than 2 seconds. Rash noted. She is not diaphoretic. There is erythema. No pallor.     Psychiatric: She has a normal mood and affect. Her behavior is normal. Judgment and thought content normal.  Nursing note and vitals reviewed.         Assessment & Plan:   No diagnosis found.  No problem-specific  Assessment & Plan notes found for this encounter.    FOLLOW-UP:  No follow-ups on file.

## 2018-07-20 ENCOUNTER — Encounter: Payer: Medicaid Other | Admitting: Adult Health

## 2018-09-13 IMAGING — CR DG CHEST 2V
2 series · 2 of 2 positions shown · non-contrast
Comparison: None.

CLINICAL DATA: Fever, shortness of breath, and left-sided chest
pain.

EXAM:
CHEST  2 VIEW

[w chest pa]
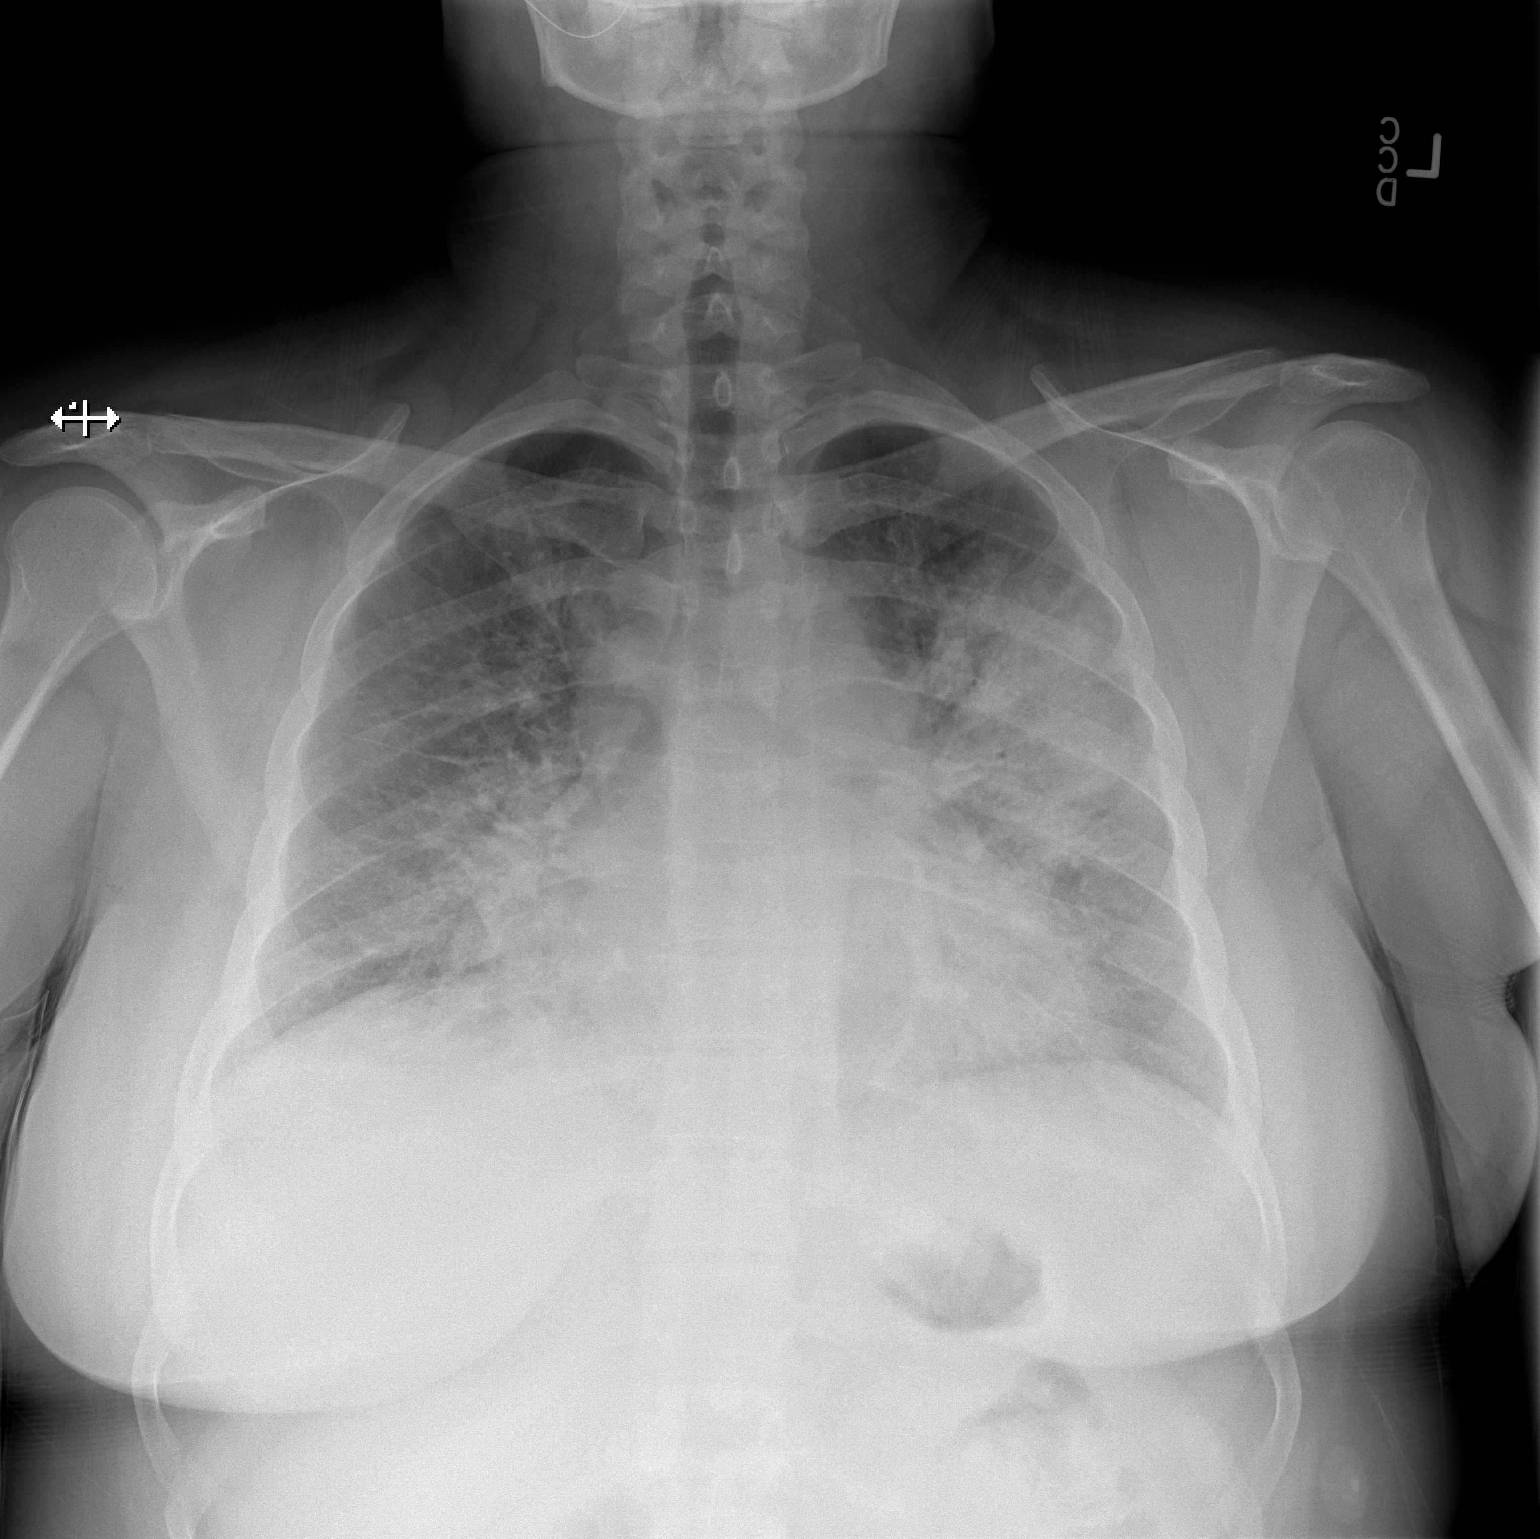

[w chest lat]
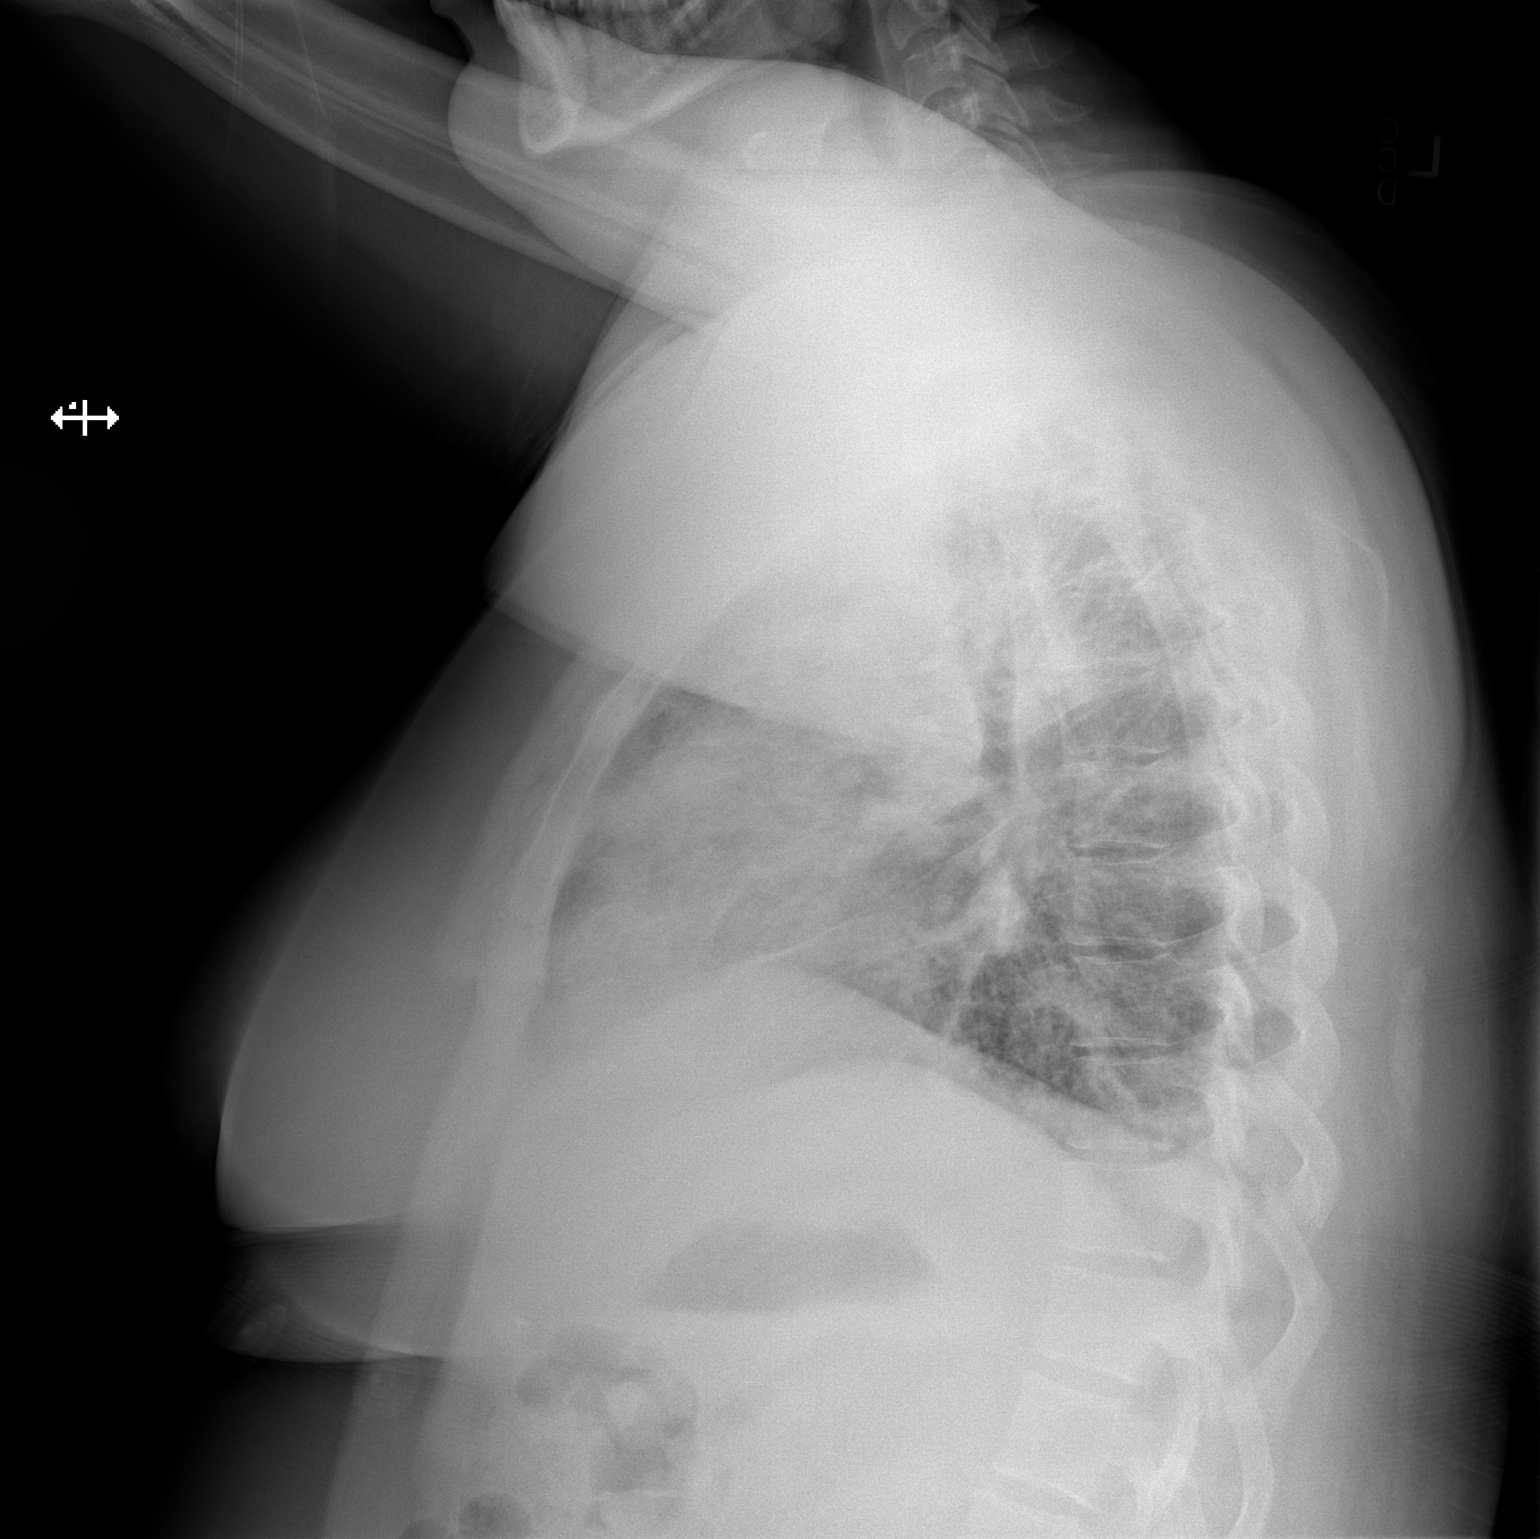

[2 of 2 positions shown; findings below may reference images not displayed]

FINDINGS: There are extensive bilateral pulmonary infiltrates, most severe in
the left upper lobe. No effusions.

Heart size and pulmonary vascularity are normal. Soft tissue
prominence in the aortopulmonary window and in the azygos region may
represent adenopathy.

Skeletal structures appear normal.
IMPRESSION: Bilateral pulmonary infiltrates consistent with pneumonia. Probable
mediastinal adenopathy.

## 2018-12-25 ENCOUNTER — Ambulatory Visit (INDEPENDENT_AMBULATORY_CARE_PROVIDER_SITE_OTHER): Payer: Self-pay | Admitting: Adult Health

## 2018-12-25 ENCOUNTER — Encounter: Payer: Self-pay | Admitting: Adult Health

## 2018-12-25 ENCOUNTER — Other Ambulatory Visit: Payer: Self-pay

## 2018-12-25 VITALS — BP 137/89 | HR 98 | Temp 98.9°F | Ht 63.25 in | Wt 195.3 lb

## 2018-12-25 DIAGNOSIS — Z0000 Encounter for general adult medical examination without abnormal findings: Secondary | ICD-10-CM

## 2018-12-25 DIAGNOSIS — Z202 Contact with and (suspected) exposure to infections with a predominantly sexual mode of transmission: Secondary | ICD-10-CM

## 2018-12-25 MED ORDER — VALACYCLOVIR HCL 1 G PO TABS: 1000.0000 mg | ORAL_TABLET | Freq: Two times a day (BID) | ORAL | 0 refills | Status: DC

## 2018-12-25 NOTE — Patient Instructions (Addendum)
Herpes Simplex Test Why am I having this test? The herpes simplex test is used to check for an infection with the herpes simplex virus (HSV). There are two common types of HSV:  Type 1 (HSV1) often causes cold sores on or around the mouth and sometimes on or around the eyes.  Type 2 (HSV2) is a sexually transmitted infection that causes sores in and around the genitals. You may need to have an HSV test if:  Your health care provider thinks that you may have an HSV infection.  You have a weakened body defense system (immune system) and you have sores around your mouth or genitals that look like HSV eruptions.  You have sex with multiple partners, or your partner has genital herpes.  You are pregnant, have herpes, and are expecting to deliver a baby vaginally in the next 6-8 weeks. What is being tested? There are two types of herpes simplex tests:  Blood test. This test checks the sample for: ? HSV antibodies. Antibodies are proteins that your body makes to help fight infection. This test checks whether antibodies against HSV are in your blood. ? HSV antigens. This checks for the presence of the HSV virus (antigen) in your blood.  Culture test. This test checks for the virus in a sample of fluid from an open sore. Culture tests take several days to complete but are very accurate. What kind of sample is taken?     Samples will be collected according to the type of tests your health care provider orders.  For the blood tests, a blood sample is usually collected by inserting a needle into a blood vessel.  For a culture test, the sample is usually collected by swabbing the fluid that is coming from an open sore during an active infection (outbreak). How are the results reported? Your test results will be reported as either positive or negative. For this test, normal results are:  Negative for HSV virus or antibodies in your blood.  Negative for HSV virus in cultured fluid. Sometimes,  the test results may report that a condition is present when it is not present (false-positive result). Sometimes, the test results may report that a condition is not present when it is present (false-negative result). What do the results mean?  A positive result may indicate that you have an active HSV infection. The presence of HSV1 or HSV2 antigens or antibodies in your blood may indicate an active HSV infection.  A negative result means that you do not have HSV1 or HSV2 virus in your blood. This may mean that you do not have HSV infection. Talk with your health care provider about what your results mean. Questions to ask your health care provider Ask your health care provider, or the department that is doing the test:  When will my results be ready?  How will I get my results?  What are my treatment options?  What other tests do I need?  What are my next steps? Summary  You may have this test if your health care provider suspects that you have a herpes simplex virus (HSV) infection.  Type 1 (HSV1) often causes cold sores on or around the mouth and sometimes on or around the eyes. Type 2 (HSV2) is a sexually transmitted infection that causes sores in and around the genitals.  The test may be done using a blood sample or a sample of fluid from an open sore.  A positive result may mean that you have an   active HSV infection. A negative result means that you probably do not have an active infection. Talk with your health care provider about what your results mean. This information is not intended to replace advice given to you by your health care provider. Make sure you discuss any questions you have with your health care provider. Document Released: 10/02/2004 Document Revised: 07/19/2017 Document Reviewed: 07/19/2017 Elsevier Interactive Patient Education  2019 Elsevier Inc.   Valacyclovir caplets What is this medicine? VALACYCLOVIR (val ay SYE kloe veer) is an antiviral  medicine. It is used to treat or prevent infections caused by certain kinds of viruses. Examples of these infections include herpes and shingles. This medicine will not cure herpes. This medicine may be used for other purposes; ask your health care provider or pharmacist if you have questions. COMMON BRAND NAME(S): Valtrex What should I tell my health care provider before I take this medicine? They need to know if you have any of these conditions: -acquired immunodeficiency syndrome (AIDS) -any other condition that may weaken the immune system -bone marrow or kidney transplant -kidney disease -an unusual or allergic reaction to valacyclovir, acyclovir, ganciclovir, valganciclovir, other medicines, foods, dyes, or preservatives -pregnant or trying to get pregnant -breast-feeding How should I use this medicine? Take this medicine by mouth with a glass of water. Follow the directions on the prescription label. You can take this medicine with or without food. Take your doses at regular intervals. Do not take your medicine more often than directed. Finish the full course prescribed by your doctor or health care professional even if you think your condition is better. Do not stop taking except on the advice of your doctor or health care professional. Talk to your pediatrician regarding the use of this medicine in children. While this drug may be prescribed for children as young as 2 years for selected conditions, precautions do apply. Overdosage: If you think you have taken too much of this medicine contact a poison control center or emergency room at once. NOTE: This medicine is only for you. Do not share this medicine with others. What if I miss a dose? If you miss a dose, take it as soon as you can. If it is almost time for your next dose, take only that dose. Do not take double or extra doses. What may interact with this medicine? -cimetidine -probenecid This list may not describe all possible  interactions. Give your health care provider a list of all the medicines, herbs, non-prescription drugs, or dietary supplements you use. Also tell them if you smoke, drink alcohol, or use illegal drugs. Some items may interact with your medicine. What should I watch for while using this medicine? Tell your doctor or health care professional if your symptoms do not start to get better after 1 week. This medicine works best when taken early in the course of an infection, within the first 72 hours. Begin treatment as soon as possible after the first signs of infection like tingling, itching, or pain in the affected area. It is possible that genital herpes may still be spread even when you are not having symptoms. Always use safer sex practices like condoms made of latex or polyurethane whenever you have sexual contact. You should stay well hydrated while taking this medicine. Drink plenty of fluids. What side effects may I notice from receiving this medicine? Side effects that you should report to your doctor or health care professional as soon as possible: -allergic reactions like skin rash, itching or  hives, swelling of the face, lips, or tongue -aggressive behavior -confusion -hallucinations -problems with balance, talking, walking -stomach pain -tremor -trouble passing urine or change in the amount of urine Side effects that usually do not require medical attention (report to your doctor or health care professional if they continue or are bothersome): -dizziness -headache -nausea, vomiting This list may not describe all possible side effects. Call your doctor for medical advice about side effects. You may report side effects to FDA at 1-800-FDA-1088. Where should I keep my medicine? Keep out of the reach of children. Store at room temperature between 15 and 25 degrees C (59 and 77 degrees F). Keep container tightly closed. Throw away any unused medicine after the expiration date. NOTE: This  sheet is a summary. It may not cover all possible information. If you have questions about this medicine, talk to your doctor, pharmacist, or health care provider.  2019 Elsevier/Gold Standard (2012-08-15 16:34:05)   Since a lesion is visible and your partner was recently diagnosed with HSV II (Herpes)- please start Valacyclovir (Valtrex) 1000mg  twice daily- take for 10 days. Avoid sexual contact for at least 10 days.  You can still shed the virus even when you do not see lesions/outbreaks. We will call you when labs and culture results are available. Increase water intake, eat heathy, reduce tobacco and alcohol intake.  Please schedule telemedicine follow-up in 4 weeks, sooner if needed.  FEEL BETTER!

## 2018-12-25 NOTE — Assessment & Plan Note (Signed)
Since a lesion is visible and your partner was recently diagnosed with HSV II (Herpes)- please start Valacyclovir (Valtrex) 1000mg  twice daily- take for 10 days. Avoid sexual contact for at least 10 days.  You can still shed the virus even when you do not see lesions/outbreaks. We will call you when labs and culture results are available. Increase water intake, eat heathy, reduce tobacco and alcohol intake.  Please schedule telemedicine follow-up in 4 weeks, sooner if needed.

## 2018-12-25 NOTE — Progress Notes (Signed)
Subjective:    Patient ID: Kerry Richards, female    DOB: 1991/02/15, 28 y.o.   MRN: [id number]  HPI:  Kerry Richards presents today genital lesions that are also painful and swollen-sx's started 4 days ago. She reports that her fiance was dx'd with HSV 2 last week and started on Valtrex. She is on her menses and experiencing the "worst period ever".  She reports heavy bleeding and passing clots. She reports intermittent abdominal cramping and nausea with vomiting- but states "I always vomit when I get nervous or upset". She denies constipation or diarrhea She denies fever/night sweats/chills She denies cough/shortness of breath/myalgia's She denies ever developing these sx's previously and she reports that her fiance denies ever "been through this before either". She continues to smoke, "about a pack/day" She estimates to drink "about a half of a fifth of liquor a day". She reports being under a lot of stress lately: - COVID- 19 Pandemic - Currently unemployed - Home schooling 6 year son - Recently moved - Recently engaged  Patient Care Team    Relationship Specialty Notifications Start End  Arran Fessel, Jinny BlossomKaty D, NP PCP - General Family Medicine  09/26/17     Patient Active Problem List   Diagnosis Date Noted  . Exposure to sexually transmitted disease (STD) 12/25/2018  . Healthcare maintenance 04/13/2018  . Rash 04/13/2018  . Anxiety 04/13/2018     Past Medical History:  Diagnosis Date  . Anxiety   . Hx of pyelonephritis   . Hypertension   . Migraine   . Trichomonas contact, treated      Past Surgical History:  Procedure Laterality Date  . CESAREAN SECTION  08/24/2012   Procedure: CESAREAN SECTION;  Surgeon: Hofmeister PilaKathy W Richardson, MD;  Location: [facility];  Service: Obstetrics;  Laterality: N/A;  Primary Cesarean Section Delivery Baby  Boy @ 0004, Apgars 9/9  . DILATION AND CURETTAGE OF UTERUS    . TOOTH EXTRACTION    . WISDOM TOOTH EXTRACTION       Family History  Problem  Relation Age of Onset  . Hypertension Father   . Migraines Father   . Stroke Father   . Arthritis Maternal Aunt   . Hypertension Maternal Aunt   . Arthritis Maternal Grandmother   . Cancer Maternal Grandmother        lung  . Diabetes Paternal Grandfather   . Heart disease Paternal Grandfather   . Hypertension Paternal Grandfather   . Heart attack Paternal Grandfather   . Heart disease Paternal Aunt   . Heart attack Paternal Aunt   . Depression Paternal Uncle   . Suicidality Paternal Uncle   . Anesthesia problems Neg Hx   . Other Neg Hx      Social History   Substance and Sexual Activity  Drug Use Yes  . Types: Marijuana     Social History   Substance and Sexual Activity  Alcohol Use Yes  . Alcohol/week: 3.0 standard drinks  . Types: 3 Shots of liquor per week     Social History   Tobacco Use  Smoking Status Current Every Day Smoker  . Packs/day: 0.50  . Years: 14.00  . Pack years: 7.00  . Types: Cigarettes  . Last attempt to quit: 09/30/2011  . Years since quitting: 7.2  Smokeless Tobacco Never Used     Outpatient Encounter Medications as of 12/25/2018  Medication Sig  . valACYclovir (VALTREX) 1000 MG tablet Take 1 tablet (1,000 mg total) by mouth 2 (two)  times daily.  . [DISCONTINUED] betamethasone valerate ointment (VALISONE) 0.1 % Apply 1 application topically 2 (two) times daily.  . [DISCONTINUED] LORazepam (ATIVAN) 0.5 MG tablet Take 1 tablet (0.5 mg total) by mouth at bedtime. As needed.  . [DISCONTINUED] sertraline (ZOLOFT) 50 MG tablet 1/2 tablet daily for one week then increase to one full tablet daily   No facility-administered encounter medications on file as of 12/25/2018.     Allergies: Metronidazole; Shellfish allergy; and Amitriptyline  Body mass index is 34.32 kg/m.  Blood pressure 137/89, pulse 98, temperature 98.9 F (37.2 C), temperature source Oral, height 5' 3.25" (1.607 m), weight 195 lb 4.8 oz (88.6 kg), last menstrual period  10/23/2018, SpO2 100 %.  Review of Systems  Constitutional: Positive for appetite change and fatigue. Negative for activity change, chills, diaphoresis, fever and unexpected weight change.  Eyes: Negative for visual disturbance.  Respiratory: Negative for cough, choking, chest tightness, shortness of breath, wheezing and stridor.   Cardiovascular: Negative for chest pain, palpitations and leg swelling.  Gastrointestinal: Positive for abdominal pain, nausea and vomiting. Negative for abdominal distention, anal bleeding, blood in stool, constipation, diarrhea and rectal pain.  Endocrine: Negative for cold intolerance, heat intolerance, polydipsia, polyphagia and polyuria.  Genitourinary: Positive for genital sores, menstrual problem, pelvic pain, vaginal bleeding and vaginal pain. Negative for decreased urine volume, difficulty urinating, dysuria, flank pain, hematuria, urgency and vaginal discharge.  Musculoskeletal: Negative for arthralgias, back pain, gait problem, joint swelling, myalgias, neck pain and neck stiffness.  Neurological: Negative for dizziness and headaches.  Hematological: Does not bruise/bleed easily.  Psychiatric/Behavioral: Positive for sleep disturbance. Negative for agitation, behavioral problems, confusion, decreased concentration, dysphoric mood, hallucinations, self-injury and suicidal ideas. The patient is nervous/anxious. The patient is not hyperactive.        Objective:   Physical Exam Vitals signs and nursing note reviewed. Exam conducted with a chaperone present.  Constitutional:      General: She is in acute distress.     Appearance: She is obese. She is not toxic-appearing or diaphoretic.  HENT:     Head: Normocephalic and atraumatic.  Eyes:     Extraocular Movements: Extraocular movements intact.     Conjunctiva/sclera: Conjunctivae normal.     Pupils: Pupils are equal, round, and reactive to light.  Cardiovascular:     Rate and Rhythm: Normal rate.      Pulses: Normal pulses.     Heart sounds: Normal heart sounds. No murmur. No friction rub. No gallop.   Pulmonary:     Effort: Pulmonary effort is normal. No respiratory distress.     Breath sounds: Normal breath sounds. No stridor. No wheezing, rhonchi or rales.  Chest:     Chest wall: No tenderness.  Abdominal:     General: Abdomen is protuberant. Bowel sounds are normal.     Tenderness: There is no abdominal tenderness.  Genitourinary:    Labia:        Right: No lesion.        Left: Tenderness and lesion present.      Vagina: No vaginal discharge.       Comments: L upper labia- cluster of light yellow lesions noted No active drainage noted Entire labia is slightly reddened and tender to the touch  Tampon in place Chaperone present during examination. Skin:    General: Skin is warm and dry.     Capillary Refill: Capillary refill takes less than 2 seconds.  Neurological:     Mental Status: She  is alert and oriented to person, place, and time.  Psychiatric:        Attention and Perception: Attention normal.        Mood and Affect: Mood normal. Affect is tearful.        Speech: Speech normal.        Behavior: Behavior normal.        Thought Content: Thought content normal.        Cognition and Memory: Cognition and memory normal.        Judgment: Judgment normal.       Assessment & Plan:   1. Exposure to sexually transmitted disease (STD)   2. Healthcare maintenance     Exposure to sexually transmitted disease (STD) Since a lesion is visible and your partner was recently diagnosed with HSV II (Herpes)- please start Valacyclovir (Valtrex) 1000mg  twice daily- take for 10 days. Avoid sexual contact for at least 10 days.  You can still shed the virus even when you do not see lesions/outbreaks. We will call you when labs and culture results are available. Increase water intake, eat heathy, reduce tobacco and alcohol intake.  Please schedule telemedicine follow-up in 4 weeks,  sooner if needed.   Pt was in the office today for 40+ minutes, I spent > 75% of time spent in face to face counseling of various medical concerns and in coordination of care  FOLLOW-UP:  Return in about 4 weeks (around 01/22/2019).

## 2018-12-27 LAB — SPECIMEN STATUS REPORT

## 2018-12-27 LAB — HSV(HERPES SIMPLEX VRS) I + II AB-IGG
HSV 1 Glycoprotein G Ab, IgG: <0.91 index (ref 0.00–0.90)
HSV 2 IgG, Type Spec: <0.91 index (ref 0.00–0.90)

## 2018-12-28 LAB — HIV ANTIBODY (ROUTINE TESTING W REFLEX)

## 2018-12-28 LAB — HSV(HERPES SIMPLEX VRS) I + II AB-IGM

## 2018-12-29 ENCOUNTER — Other Ambulatory Visit: Payer: Self-pay | Admitting: Adult Health

## 2018-12-29 LAB — CHLAMYDIA/GONOCOCCUS/TRICHOMONAS, NAA
Chlamydia by NAA: NEGATIVE
Gonococcus by NAA: NEGATIVE
Trich vag by NAA: NEGATIVE

## 2018-12-29 LAB — COMPREHENSIVE METABOLIC PANEL
ALT: 10 IU/L (ref 0–32)
AST: 13 IU/L (ref 0–40)
Albumin/Globulin Ratio: 1.2 (ref 1.2–2.2)
Albumin: 4.3 g/dL (ref 3.9–5.0)
Alkaline Phosphatase: 87 IU/L (ref 39–117)
BUN/Creatinine Ratio: 17 (ref 9–23)
BUN: 13 mg/dL (ref 6–20)
Bilirubin Total: 0.2 mg/dL (ref 0.0–1.2)
CO2: 20 mmol/L (ref 20–29)
Calcium: 9.1 mg/dL (ref 8.7–10.2)
Chloride: 100 mmol/L (ref 96–106)
Creatinine, Ser: 0.76 mg/dL (ref 0.57–1.00)
GFR calc Af Amer: 123 mL/min/{1.73_m2} (ref >59)
GFR calc non Af Amer: 106 mL/min/{1.73_m2} (ref >59)
Globulin, Total: 3.5 g/dL (ref 1.5–4.5)
Glucose: 106 mg/dL — ABNORMAL HIGH (ref 65–99)
Potassium: 4.2 mmol/L (ref 3.5–5.2)
Sodium: 140 mmol/L (ref 134–144)
Total Protein: 7.8 g/dL (ref 6.0–8.5)

## 2018-12-29 LAB — SPECIMEN STATUS REPORT

## 2018-12-29 LAB — HERPES SIMPLEX VIRUS CULTURE

## 2018-12-29 LAB — HSV(HERPES SIMPLEX VRS) I + II AB-IGM: HSVI/II Comb IgM: <0.91 Ratio (ref 0.00–0.90)

## 2019-01-24 ENCOUNTER — Other Ambulatory Visit: Payer: Self-pay

## 2019-01-24 ENCOUNTER — Ambulatory Visit: Payer: Medicaid Other | Admitting: Adult Health

## 2019-01-24 DIAGNOSIS — Z0000 Encounter for general adult medical examination without abnormal findings: Secondary | ICD-10-CM

## 2019-01-24 NOTE — Progress Notes (Deleted)
Virtual Visit via Telephone Note  I connected with Kerry Richards on 01/24/19 at  3:30 PM EDT by telephone and verified that I am speaking with the correct person using two identifiers.  Location: Patient: Home Provider: In Clinic   I discussed the limitations, risks, security and privacy concerns of performing an evaluation and management service by telephone and the availability of in person appointments. I also discussed with the patient that there may be a patient responsible charge related to this service. The patient expressed understanding and agreed to proceed.   History of Present Illness: 12/25/2018 OV:   Kerry Richards presents today genital lesions that are also painful and swollen-sx's started 4 days ago. She reports that her fiance was dx'd with HSV 2 last week and started on Valtrex. She is on her menses and experiencing the "worst period ever".  She reports heavy bleeding and passing clots. She reports intermittent abdominal cramping and nausea with vomiting- but states "I always vomit when I get nervous or upset". She denies constipation or diarrhea She denies fever/night sweats/chills She denies cough/shortness of breath/myalgia's She denies ever developing these sx's previously and she reports that her fiance denies ever "been through this before either". She continues to smoke, "about a pack/day" She estimates to drink "about a half of a fifth of liquor a day". She reports being under a lot of stress lately: - COVID- 19 Pandemic - Currently unemployed - Home schooling 6 year son - Recently moved - Recently engaged 01/24/2019 OV: Ms/ Kerry Richards calls in today    Patient Care Team    Relationship Specialty Notifications Start End  Julaine Fusi, NP PCP - General Family Medicine  09/26/17     Patient Active Problem List   Diagnosis Date Noted  . Exposure to sexually transmitted disease (STD) 12/25/2018  . Healthcare maintenance 04/13/2018  . Rash 04/13/2018  .  Anxiety 04/13/2018     Past Medical History:  Diagnosis Date  . Anxiety   . Hx of pyelonephritis   . Hypertension   . Migraine   . Trichomonas contact, treated      Past Surgical History:  Procedure Laterality Date  . CESAREAN SECTION  08/24/2012   Procedure: CESAREAN SECTION;  Surgeon: Justen Pila, MD;  Location: [facility];  Service: Obstetrics;  Laterality: N/A;  Primary Cesarean Section Delivery Baby  Boy @ 0004, Apgars 9/9  . DILATION AND CURETTAGE OF UTERUS    . TOOTH EXTRACTION    . WISDOM TOOTH EXTRACTION       Family History  Problem Relation Age of Onset  . Hypertension Father   . Migraines Father   . Stroke Father   . Arthritis Maternal Aunt   . Hypertension Maternal Aunt   . Arthritis Maternal Grandmother   . Cancer Maternal Grandmother        lung  . Diabetes Paternal Grandfather   . Heart disease Paternal Grandfather   . Hypertension Paternal Grandfather   . Heart attack Paternal Grandfather   . Heart disease Paternal Aunt   . Heart attack Paternal Aunt   . Depression Paternal Uncle   . Suicidality Paternal Uncle   . Anesthesia problems Neg Hx   . Other Neg Hx      Social History   Substance and Sexual Activity  Drug Use Yes  . Types: Marijuana     Social History   Substance and Sexual Activity  Alcohol Use Yes  . Alcohol/week: 3.0 standard drinks  . Types:  3 Shots of liquor per week     Social History   Tobacco Use  Smoking Status Current Every Day Smoker  . Packs/day: 0.50  . Years: 14.00  . Pack years: 7.00  . Types: Cigarettes  . Last attempt to quit: 09/30/2011  . Years since quitting: 7.3  Smokeless Tobacco Never Used     Outpatient Encounter Medications as of 01/24/2019  Medication Sig  . valACYclovir (VALTREX) 1000 MG tablet Take 1 tablet (1,000 mg total) by mouth 2 (two) times daily.   No facility-administered encounter medications on file as of 01/24/2019.     Allergies: Metronidazole; Shellfish allergy; and  Amitriptyline  There is no height or weight on file to calculate BMI.  There were no vitals taken for this visit. Review of Systems: General:   No F/C, wt loss Pulm:   No DIB, SOB, pleuritic chest pain Card:  No CP, palpitations Abd:  No n/v/d or pain Ext:  No inc edema from baseline This patient does not have sx concerning for COVID-19 Infection (ie; fever, chills, cough, new or worsening shortness of breath). Observations/Objective:   Assessment and Plan:  COVID-19 Education: Signs and symptoms of COVID-19 infection were discussed with pt and how to seek care for testing.  The importance of following the Stay at Home order, and when out- Social Distancing and wearing a facial mask were discussed today.  Follow Up Instructions:    I discussed the assessment and treatment plan with the patient. The patient was provided an opportunity to ask questions and all were answered. The patient agreed with the plan and demonstrated an understanding of the instructions.   The patient was advised to call back or seek an in-person evaluation if the symptoms worsen or if the condition fails to improve as anticipated.  I provided *** minutes of non-face-to-face time during this encounter.   Julaine FusiKaty D Danford, NP

## 2019-01-25 NOTE — Progress Notes (Signed)
NO SHOW- NO OV

## 2019-02-22 DIAGNOSIS — R7301 Impaired fasting glucose: Secondary | ICD-10-CM | POA: Insufficient documentation

## 2019-06-04 LAB — OB RESULTS CONSOLE ANTIBODY SCREEN: Antibody Screen: NEGATIVE

## 2019-06-04 LAB — OB RESULTS CONSOLE GBS: GBS: POSITIVE

## 2019-06-04 LAB — OB RESULTS CONSOLE HIV ANTIBODY (ROUTINE TESTING): HIV: NONREACTIVE

## 2019-06-04 LAB — OB RESULTS CONSOLE ABO/RH: RH Type: POSITIVE

## 2019-06-04 LAB — OB RESULTS CONSOLE GC/CHLAMYDIA
Chlamydia: NEGATIVE
Gonorrhea: NEGATIVE

## 2019-06-04 LAB — CBC AND DIFFERENTIAL
HCT: 39 (ref 36–46)
Hemoglobin: 13.5 (ref 12.0–16.0)
Platelets: 307 (ref 150–399)

## 2019-06-04 LAB — HM PAP SMEAR: HM Pap smear: ABNORMAL

## 2019-06-04 LAB — HIV ANTIBODY (ROUTINE TESTING W REFLEX): HIV 1&2 Ab, 4th Generation: NEGATIVE

## 2019-06-04 LAB — OB RESULTS CONSOLE HEPATITIS B SURFACE ANTIGEN: Hepatitis B Surface Ag: NEGATIVE

## 2019-06-04 LAB — OB RESULTS CONSOLE RPR: RPR: NONREACTIVE

## 2019-06-04 LAB — HM HEPATITIS C SCREENING LAB: HM Hepatitis Screen: NEGATIVE

## 2019-06-04 LAB — OB RESULTS CONSOLE RUBELLA ANTIBODY, IGM: Rubella: IMMUNE

## 2019-06-04 LAB — HM HIV SCREENING LAB: HM HIV Screening: NEGATIVE

## 2019-06-11 ENCOUNTER — Other Ambulatory Visit: Payer: Self-pay

## 2019-06-11 ENCOUNTER — Emergency Department (HOSPITAL_COMMUNITY)
Admission: EM | Admit: 2019-06-11 | Discharge: 2019-06-11 | Disposition: A | Discharge status: Home / Self Care | Payer: Medicaid Other | Attending: Emergency Medicine | Admitting: Emergency Medicine

## 2019-06-11 ENCOUNTER — Encounter (HOSPITAL_COMMUNITY): Payer: Self-pay | Admitting: *Deleted

## 2019-06-11 DIAGNOSIS — Z79899 Other long term (current) drug therapy: Secondary | ICD-10-CM | POA: Diagnosis not present

## 2019-06-11 DIAGNOSIS — O98512 Other viral diseases complicating pregnancy, second trimester: Secondary | ICD-10-CM | POA: Diagnosis not present

## 2019-06-11 DIAGNOSIS — R070 Pain in throat: Secondary | ICD-10-CM | POA: Diagnosis not present

## 2019-06-11 DIAGNOSIS — Z20828 Contact with and (suspected) exposure to other viral communicable diseases: Secondary | ICD-10-CM | POA: Insufficient documentation

## 2019-06-11 DIAGNOSIS — O10012 Pre-existing essential hypertension complicating pregnancy, second trimester: Secondary | ICD-10-CM | POA: Insufficient documentation

## 2019-06-11 DIAGNOSIS — Z3A15 15 weeks gestation of pregnancy: Secondary | ICD-10-CM | POA: Diagnosis not present

## 2019-06-11 DIAGNOSIS — H1032 Unspecified acute conjunctivitis, left eye: Secondary | ICD-10-CM | POA: Diagnosis not present

## 2019-06-11 DIAGNOSIS — O99282 Endocrine, nutritional and metabolic diseases complicating pregnancy, second trimester: Secondary | ICD-10-CM | POA: Insufficient documentation

## 2019-06-11 DIAGNOSIS — J029 Acute pharyngitis, unspecified: Secondary | ICD-10-CM | POA: Diagnosis not present

## 2019-06-11 DIAGNOSIS — O9989 Other specified diseases and conditions complicating pregnancy, childbirth and the puerperium: Secondary | ICD-10-CM | POA: Insufficient documentation

## 2019-06-11 DIAGNOSIS — F17210 Nicotine dependence, cigarettes, uncomplicated: Secondary | ICD-10-CM | POA: Insufficient documentation

## 2019-06-11 DIAGNOSIS — E860 Dehydration: Secondary | ICD-10-CM

## 2019-06-11 LAB — CBC WITH DIFFERENTIAL/PLATELET
Abs Immature Granulocytes: 0.03 10*3/uL (ref 0.00–0.07)
Basophils Absolute: 0 10*3/uL (ref 0.0–0.1)
Basophils Relative: 0 %
Eosinophils Absolute: 0 10*3/uL (ref 0.0–0.5)
Eosinophils Relative: 0 %
HCT: 35.5 % — ABNORMAL LOW (ref 36.0–46.0)
Hemoglobin: 12.4 g/dL (ref 12.0–15.0)
Immature Granulocytes: 0 %
Lymphocytes Relative: 17 %
Lymphs Abs: 1.3 10*3/uL (ref 0.7–4.0)
MCH: 31.1 pg (ref 26.0–34.0)
MCHC: 34.9 g/dL (ref 30.0–36.0)
MCV: 89 fL (ref 80.0–100.0)
Monocytes Absolute: 0.8 10*3/uL (ref 0.1–1.0)
Monocytes Relative: 11 %
Neutro Abs: 5.3 10*3/uL (ref 1.7–7.7)
Neutrophils Relative %: 72 %
Platelets: 276 10*3/uL (ref 150–400)
RBC: 3.99 MIL/uL (ref 3.87–5.11)
RDW: 12.1 % (ref 11.5–15.5)
WBC: 7.5 10*3/uL (ref 4.0–10.5)
nRBC: 0 % (ref 0.0–0.2)

## 2019-06-11 LAB — COMPREHENSIVE METABOLIC PANEL
ALT: 15 U/L (ref 0–44)
AST: 17 U/L (ref 15–41)
Albumin: 3.1 g/dL — ABNORMAL LOW (ref 3.5–5.0)
Alkaline Phosphatase: 69 U/L (ref 38–126)
Anion gap: 6 (ref 5–15)
BUN: 10 mg/dL (ref 6–20)
CO2: 24 mmol/L (ref 22–32)
Calcium: 8.6 mg/dL — ABNORMAL LOW (ref 8.9–10.3)
Chloride: 106 mmol/L (ref 98–111)
Creatinine, Ser: 0.64 mg/dL (ref 0.44–1.00)
GFR calc Af Amer: >60 mL/min (ref >60)
GFR calc non Af Amer: >60 mL/min (ref >60)
Glucose, Bld: 111 mg/dL — ABNORMAL HIGH (ref 70–99)
Potassium: 3.6 mmol/L (ref 3.5–5.1)
Sodium: 136 mmol/L (ref 135–145)
Total Bilirubin: 0.3 mg/dL (ref 0.3–1.2)
Total Protein: 7.7 g/dL (ref 6.5–8.1)

## 2019-06-11 LAB — URINALYSIS, ROUTINE W REFLEX MICROSCOPIC
Bilirubin Urine: NEGATIVE
Glucose, UA: NEGATIVE mg/dL
Hgb urine dipstick: NEGATIVE
Ketones, ur: NEGATIVE mg/dL
Leukocytes,Ua: NEGATIVE
Nitrite: NEGATIVE
Protein, ur: NEGATIVE mg/dL
Specific Gravity, Urine: 1.028 (ref 1.005–1.030)
pH: 6 (ref 5.0–8.0)

## 2019-06-11 LAB — SARS CORONAVIRUS 2 (TAT 6-24 HRS): SARS Coronavirus 2: NEGATIVE

## 2019-06-11 LAB — GROUP A STREP BY PCR: Group A Strep by PCR: NOT DETECTED

## 2019-06-11 MED ORDER — SODIUM CHLORIDE 0.9 % IV SOLN
3.0000 g | Freq: Once | INTRAVENOUS | Status: AC
  Administered 2019-06-11: 3 g via INTRAVENOUS
  Filled 2019-06-11: qty 8

## 2019-06-11 MED ORDER — SODIUM CHLORIDE 0.9 % IV BOLUS
1000.0000 mL | Freq: Once | INTRAVENOUS | Status: AC
  Administered 2019-06-11: 1000 mL via INTRAVENOUS

## 2019-06-11 MED ORDER — HYDROCODONE-ACETAMINOPHEN 5-325 MG PO TABS: 1.0000 | ORAL_TABLET | ORAL | 0 refills | Status: DC | PRN

## 2019-06-11 MED ORDER — ERYTHROMYCIN 5 MG/GM OP OINT: TOPICAL_OINTMENT | OPHTHALMIC | 0 refills | Status: DC

## 2019-06-11 MED ORDER — AMOXICILLIN-POT CLAVULANATE 875-125 MG PO TABS: 1.0000 | ORAL_TABLET | Freq: Two times a day (BID) | ORAL | 0 refills | Status: DC

## 2019-06-11 MED ORDER — ACETAMINOPHEN 500 MG PO TABS
1000.0000 mg | ORAL_TABLET | Freq: Once | ORAL | Status: AC
  Administered 2019-06-11: 1000 mg via ORAL
  Filled 2019-06-11: qty 2

## 2019-06-11 MED ORDER — SODIUM CHLORIDE 0.9% FLUSH
3.0000 mL | Freq: Once | INTRAVENOUS | Status: AC
  Administered 2019-06-11: 09:00:00 3 mL via INTRAVENOUS

## 2019-06-11 MED ORDER — LIDOCAINE VISCOUS HCL 2 % MT SOLN
15.0000 mL | Freq: Once | OROMUCOSAL | Status: AC
  Administered 2019-06-11: 15 mL via OROMUCOSAL
  Filled 2019-06-11: qty 15

## 2019-06-11 NOTE — ED Triage Notes (Addendum)
Pt c/o pink eye and sore throat. Reports she was seen last Saturday for the same. At that time she was also told that she has a UTI (reports she is not having any symptoms of UTI like burning, pain, frequency).  She was given meds for the drops, ointment, abx for strep and UTI. Reports she finished the meds and she is "just getting worse"-mainly the pain in her throat. Reports she has also had fevers.   Pt reporting she is approx [redacted] weeks pregnant, last saw OBGYN last week. She denies abdominal pain, vag discharge, or vaginal bleeding.

## 2019-06-11 NOTE — ED Notes (Signed)
Fetal heart tones assessed by ED PA, 112bpm noted

## 2019-06-11 NOTE — ED Provider Notes (Signed)
[facility] Provider Note   CSN: [id number] Arrival date & time: 06/11/19  0006     History   Chief Complaint No chief complaint on file.   HPI Kerry Richards is a 28 y.o. female.     Pt presents to the ED today with feeling poorly.  She was seen last Saturday (9/26) for sore throat and left eye pain.  Pt said she was told she had conjunctivitis, strep, and a UTI.  She was put on amox and polytrim and has not improved.  She is [redacted] weeks pregnant.  She saw her obgyn last week.  No abdominal pain or vaginal bleeding.  No known covid exposures.  She did say her son had adenovirus last week.  He was not checked for covid.     Past Medical History:  Diagnosis Date  . Anxiety   . Hx of pyelonephritis   . Hypertension   . Migraine   . Trichomonas contact, treated     Patient Active Problem List   Diagnosis Date Noted  . Elevated fasting glucose 02/22/2019  . Exposure to sexually transmitted disease (STD) 12/25/2018  . Healthcare maintenance 04/13/2018  . Rash 04/13/2018  . Anxiety 04/13/2018    Past Surgical History:  Procedure Laterality Date  . CESAREAN SECTION  08/24/2012   Procedure: CESAREAN SECTION;  Surgeon: Bell PilaKathy W Richardson, MD;  Location: [facility];  Service: Obstetrics;  Laterality: N/A;  Primary Cesarean Section Delivery Baby  Boy @ 0004, Apgars 9/9  . DILATION AND CURETTAGE OF UTERUS    . TOOTH EXTRACTION    . WISDOM TOOTH EXTRACTION       OB History    Gravida  4   Para  1   Term  1   Preterm  0   AB  2   Living  1     SAB  1   TAB  1   Ectopic      Multiple      Live Births  1            Home Medications    Prior to Admission medications   Medication Sig Start Date End Date Taking? Authorizing Provider  amoxicillin-clavulanate (AUGMENTIN) 875-125 MG tablet Take 1 tablet by mouth every 12 (twelve) hours. 06/11/19   Jacalyn LefevreHaviland, Marijayne Rauth, MD  erythromycin ophthalmic ointment Place a 1/2 inch ribbon  of ointment into the lower eyelid q 3-4hr 06/11/19   Jacalyn LefevreHaviland, Khloee Garza, MD  HYDROcodone-acetaminophen (NORCO/VICODIN) 5-325 MG tablet Take 1 tablet by mouth every 4 (four) hours as needed. 06/11/19   Jacalyn LefevreHaviland, Damon Hargrove, MD  valACYclovir (VALTREX) 1000 MG tablet Take 1 tablet (1,000 mg total) by mouth 2 (two) times daily. 12/25/18   DanfordJinny Blossom, Katy D, NP    Family History Family History  Problem Relation Age of Onset  . Hypertension Father   . Migraines Father   . Stroke Father   . Arthritis Maternal Aunt   . Hypertension Maternal Aunt   . Arthritis Maternal Grandmother   . Cancer Maternal Grandmother        lung  . Diabetes Paternal Grandfather   . Heart disease Paternal Grandfather   . Hypertension Paternal Grandfather   . Heart attack Paternal Grandfather   . Heart disease Paternal Aunt   . Heart attack Paternal Aunt   . Depression Paternal Uncle   . Suicidality Paternal Uncle   . Anesthesia problems Neg Hx   . Other Neg Hx  Social History Social History   Tobacco Use  . Smoking status: Current Every Day Smoker    Packs/day: 0.50    Years: 14.00    Pack years: 7.00    Types: Cigarettes    Last attempt to quit: 09/30/2011    Years since quitting: 7.7  . Smokeless tobacco: Never Used  Substance Use Topics  . Alcohol use: Yes    Alcohol/week: 3.0 standard drinks    Types: 3 Shots of liquor per week  . Drug use: Yes    Types: Marijuana     Allergies   Metronidazole, Shellfish allergy, and Amitriptyline   Review of Systems Review of Systems  Constitutional: Positive for fatigue and fever.  HENT: Positive for sore throat.   Eyes: Positive for pain and redness.  All other systems reviewed and are negative.    Physical Exam Updated Vital Signs BP (!) 142/75   Pulse 86   Temp 99.7 F (37.6 C) (Oral)   Resp 20   LMP 10/23/2018 (Exact Date)   SpO2 100%   Physical Exam Vitals signs and nursing note reviewed.  Constitutional:      Appearance: Normal appearance.   HENT:     Head: Normocephalic and atraumatic.     Right Ear: External ear normal.     Left Ear: External ear normal.     Nose: Nose normal.     Mouth/Throat:     Mouth: Mucous membranes are dry.     Pharynx: Posterior oropharyngeal erythema present.     Tonsils: Tonsillar exudate present.  Eyes:     General:        Left eye: Discharge present.    Extraocular Movements: Extraocular movements intact.     Conjunctiva/sclera:     Left eye: Left conjunctiva is injected. Chemosis and exudate present.     Pupils: Pupils are equal, round, and reactive to light.  Neck:     Musculoskeletal: Normal range of motion and neck supple.  Cardiovascular:     Rate and Rhythm: Regular rhythm. Tachycardia present.     Pulses: Normal pulses.     Heart sounds: Normal heart sounds.  Pulmonary:     Effort: Pulmonary effort is normal.     Breath sounds: Normal breath sounds.  Abdominal:     General: Abdomen is flat. Bowel sounds are normal.     Palpations: Abdomen is soft.  Musculoskeletal: Normal range of motion.  Lymphadenopathy:     Cervical: Cervical adenopathy present.  Skin:    General: Skin is warm.     Capillary Refill: Capillary refill takes less than 2 seconds.  Neurological:     General: No focal deficit present.     Mental Status: She is alert and oriented to person, place, and time.  Psychiatric:        Mood and Affect: Mood normal.        Behavior: Behavior normal.        Thought Content: Thought content normal.        Judgment: Judgment normal.      ED Treatments / Results  Labs (all labs ordered are listed, but only abnormal results are displayed) Labs Reviewed  COMPREHENSIVE METABOLIC PANEL - Abnormal; Notable for the following components:      Result Value   Glucose, Bld 111 (*)    Calcium 8.6 (*)    Albumin 3.1 (*)    All other components within normal limits  CBC WITH DIFFERENTIAL/PLATELET - Abnormal; Notable for the following  components:   HCT 35.5 (*)    All  other components within normal limits  URINALYSIS, ROUTINE W REFLEX MICROSCOPIC - Abnormal; Notable for the following components:   APPearance HAZY (*)    All other components within normal limits  GROUP A STREP BY PCR  SARS CORONAVIRUS 2 (TAT 6-24 HRS)    EKG None  Radiology No results found.  Procedures Procedures (including critical care time)  Medications Ordered in ED Medications  lidocaine (XYLOCAINE) 2 % viscous mouth solution 15 mL (has no administration in time range)  sodium chloride flush (NS) 0.9 % injection 3 mL (3 mLs Intravenous Given 06/11/19 0835)  sodium chloride 0.9 % bolus 1,000 mL (0 mLs Intravenous Stopped 06/11/19 1000)  Ampicillin-Sulbactam (UNASYN) 3 g in sodium chloride 0.9 % 100 mL IVPB (0 g Intravenous Stopped 06/11/19 1000)  acetaminophen (TYLENOL) tablet 1,000 mg (1,000 mg Oral Given 06/11/19 0902)     Initial Impression / Assessment and Plan / ED Course  I have reviewed the triage vital signs and the nursing notes.  Pertinent labs & imaging results that were available during my care of the patient were reviewed by me and considered in my medical decision making (see chart for details).       Pt is feeling better after IVFs.  She is swabbed for covid, but results are pending. She could have adenovirus like her son, but she has huge tonsils with exudates, so she will be treated with abx.   Pt is stable for d/c home.  Return if worse.  Kerry Richards was evaluated in Emergency Department on 06/11/2019 for the symptoms described in the history of present illness. She was evaluated in the context of the global COVID-19 pandemic, which necessitated consideration that the patient might be at risk for infection with the SARS-CoV-2 virus that causes COVID-19. Institutional protocols and algorithms that pertain to the evaluation of patients at risk for COVID-19 are in a state of rapid change based on information released by regulatory bodies including the CDC and  federal and state organizations. These policies and algorithms were followed during the patient's care in the ED.  Final Clinical Impressions(s) / ED Diagnoses   Final diagnoses:  Acute pharyngitis, unspecified etiology  Dehydration  Acute bacterial conjunctivitis of left eye    ED Discharge Orders         Ordered    amoxicillin-clavulanate (AUGMENTIN) 875-125 MG tablet  Every 12 hours     06/11/19 0959    erythromycin ophthalmic ointment     06/11/19 0959    HYDROcodone-acetaminophen (NORCO/VICODIN) 5-325 MG tablet  Every 4 hours PRN     06/11/19 0959           Isla Pence, MD 06/11/19 1001

## 2019-06-11 NOTE — ED Notes (Signed)
Unable to sign d/c pad electronically. Pt given d/c paperwork, instructions explained, given opportunity to ask questions. Pt affirms understanding of paperwork and need for f/u with PCP.

## 2019-06-11 NOTE — ED Provider Notes (Signed)
Fetal heart tones assessed by me personally. Initially 112 however repeat at 130 with dopler with good waveform. No abd tenderness.   Kerry Richards A, PA-C 06/11/19 1051    Isla Pence, MD 06/11/19 1152

## 2019-07-10 DIAGNOSIS — A6000 Herpesviral infection of urogenital system, unspecified: Secondary | ICD-10-CM | POA: Insufficient documentation

## 2019-07-18 ENCOUNTER — Encounter: Payer: Self-pay | Admitting: Adult Health

## 2019-11-28 ENCOUNTER — Encounter (HOSPITAL_COMMUNITY): Payer: Self-pay | Admitting: *Deleted

## 2019-11-29 ENCOUNTER — Other Ambulatory Visit: Payer: Self-pay

## 2019-11-29 ENCOUNTER — Inpatient Hospital Stay (HOSPITAL_COMMUNITY): Admit: 2019-11-29 | Payer: Medicaid Other | Admitting: Obstetrics and Gynecology

## 2019-11-29 ENCOUNTER — Inpatient Hospital Stay (HOSPITAL_COMMUNITY): Payer: Medicaid Other | Admitting: Certified Registered Nurse Anesthetist

## 2019-11-29 ENCOUNTER — Encounter (HOSPITAL_COMMUNITY)
Admission: AD | Disposition: A | Discharge status: Home / Self Care | Payer: Self-pay | Source: Home / Self Care | Attending: Obstetrics and Gynecology

## 2019-11-29 ENCOUNTER — Encounter (HOSPITAL_COMMUNITY): Payer: Self-pay | Admitting: Obstetrics and Gynecology

## 2019-11-29 ENCOUNTER — Inpatient Hospital Stay (HOSPITAL_COMMUNITY)
Admission: AD | Admit: 2019-11-29 | Discharge: 2019-12-02 | DRG: 785 | Disposition: A | Discharge status: Home / Self Care | Payer: Medicaid Other | Attending: Obstetrics and Gynecology | Admitting: Obstetrics and Gynecology

## 2019-11-29 DIAGNOSIS — Z3A37 37 weeks gestation of pregnancy: Secondary | ICD-10-CM | POA: Diagnosis not present

## 2019-11-29 DIAGNOSIS — O99824 Streptococcus B carrier state complicating childbirth: Secondary | ICD-10-CM | POA: Diagnosis present

## 2019-11-29 DIAGNOSIS — O99334 Smoking (tobacco) complicating childbirth: Secondary | ICD-10-CM | POA: Diagnosis present

## 2019-11-29 DIAGNOSIS — Z6791 Unspecified blood type, Rh negative: Secondary | ICD-10-CM | POA: Diagnosis not present

## 2019-11-29 DIAGNOSIS — O34211 Maternal care for low transverse scar from previous cesarean delivery: Secondary | ICD-10-CM | POA: Diagnosis present

## 2019-11-29 DIAGNOSIS — O1002 Pre-existing essential hypertension complicating childbirth: Principal | ICD-10-CM | POA: Diagnosis present

## 2019-11-29 DIAGNOSIS — Z302 Encounter for sterilization: Secondary | ICD-10-CM

## 2019-11-29 DIAGNOSIS — O26893 Other specified pregnancy related conditions, third trimester: Secondary | ICD-10-CM | POA: Diagnosis present

## 2019-11-29 DIAGNOSIS — Z20822 Contact with and (suspected) exposure to covid-19: Secondary | ICD-10-CM | POA: Diagnosis present

## 2019-11-29 DIAGNOSIS — Z98891 History of uterine scar from previous surgery: Secondary | ICD-10-CM

## 2019-11-29 DIAGNOSIS — F17210 Nicotine dependence, cigarettes, uncomplicated: Secondary | ICD-10-CM | POA: Diagnosis present

## 2019-11-29 DIAGNOSIS — O99214 Obesity complicating childbirth: Secondary | ICD-10-CM | POA: Diagnosis present

## 2019-11-29 HISTORY — DX: History of uterine scar from previous surgery: Z98.891

## 2019-11-29 LAB — CBC
HCT: 36.9 % (ref 36.0–46.0)
Hemoglobin: 12.2 g/dL (ref 12.0–15.0)
MCH: 29 pg (ref 26.0–34.0)
MCHC: 33.1 g/dL (ref 30.0–36.0)
MCV: 87.6 fL (ref 80.0–100.0)
Platelets: 318 10*3/uL (ref 150–400)
RBC: 4.21 MIL/uL (ref 3.87–5.11)
RDW: 12.7 % (ref 11.5–15.5)
WBC: 10.6 10*3/uL — ABNORMAL HIGH (ref 4.0–10.5)
nRBC: 0 % (ref 0.0–0.2)

## 2019-11-29 LAB — COMPREHENSIVE METABOLIC PANEL
ALT: 12 U/L (ref 0–44)
AST: 15 U/L (ref 15–41)
Albumin: 2.6 g/dL — ABNORMAL LOW (ref 3.5–5.0)
Alkaline Phosphatase: 164 U/L — ABNORMAL HIGH (ref 38–126)
Anion gap: 10 (ref 5–15)
BUN: 8 mg/dL (ref 6–20)
CO2: 21 mmol/L — ABNORMAL LOW (ref 22–32)
Calcium: 8.5 mg/dL — ABNORMAL LOW (ref 8.9–10.3)
Chloride: 105 mmol/L (ref 98–111)
Creatinine, Ser: 0.6 mg/dL (ref 0.44–1.00)
GFR calc Af Amer: >60 mL/min (ref >60)
GFR calc non Af Amer: >60 mL/min (ref >60)
Glucose, Bld: 84 mg/dL (ref 70–99)
Potassium: 4 mmol/L (ref 3.5–5.1)
Sodium: 136 mmol/L (ref 135–145)
Total Bilirubin: 0.4 mg/dL (ref 0.3–1.2)
Total Protein: 7 g/dL (ref 6.5–8.1)

## 2019-11-29 LAB — RESPIRATORY PANEL BY RT PCR (FLU A&B, COVID)
Influenza A by PCR: NEGATIVE
Influenza B by PCR: NEGATIVE
SARS Coronavirus 2 by RT PCR: NEGATIVE

## 2019-11-29 LAB — URINALYSIS, ROUTINE W REFLEX MICROSCOPIC
Bilirubin Urine: NEGATIVE
Glucose, UA: NEGATIVE mg/dL
Hgb urine dipstick: NEGATIVE
Ketones, ur: NEGATIVE mg/dL
Leukocytes,Ua: NEGATIVE
Nitrite: NEGATIVE
Protein, ur: 30 mg/dL — AB
Specific Gravity, Urine: 1.025 (ref 1.005–1.030)
pH: 7 (ref 5.0–8.0)

## 2019-11-29 LAB — PROTEIN / CREATININE RATIO, URINE
Creatinine, Urine: 152.77 mg/dL
Protein Creatinine Ratio: 0.1 mg/mg{Cre} (ref 0.00–0.15)
Total Protein, Urine: 15 mg/dL

## 2019-11-29 LAB — TYPE AND SCREEN
ABO/RH(D): A POS
Antibody Screen: NEGATIVE

## 2019-11-29 LAB — ABO/RH: ABO/RH(D): A POS

## 2019-11-29 SURGERY — Surgical Case
Anesthesia: Spinal | Laterality: Bilateral

## 2019-11-29 MED ORDER — NALBUPHINE HCL 10 MG/ML IJ SOLN: 5.0000 mg | INTRAMUSCULAR | Status: DC | PRN

## 2019-11-29 MED ORDER — NALBUPHINE HCL 10 MG/ML IJ SOLN: 5.0000 mg | Freq: Once | INTRAMUSCULAR | Status: DC | PRN

## 2019-11-29 MED ORDER — BUPIVACAINE IN DEXTROSE 0.75-8.25 % IT SOLN
INTRATHECAL | Status: DC | PRN
  Administered 2019-11-29: 1.5 mL via INTRATHECAL

## 2019-11-29 MED ORDER — SOD CITRATE-CITRIC ACID 500-334 MG/5ML PO SOLN
ORAL | Status: AC
  Filled 2019-11-29: qty 15

## 2019-11-29 MED ORDER — KETOROLAC TROMETHAMINE 30 MG/ML IJ SOLN: 30.0000 mg | Freq: Four times a day (QID) | INTRAMUSCULAR | Status: AC | PRN

## 2019-11-29 MED ORDER — LACTATED RINGERS IV SOLN: INTRAVENOUS | Status: DC | PRN

## 2019-11-29 MED ORDER — MORPHINE SULFATE (PF) 0.5 MG/ML IJ SOLN
INTRAMUSCULAR | Status: AC
  Filled 2019-11-29: qty 10

## 2019-11-29 MED ORDER — DIPHENHYDRAMINE HCL 25 MG PO CAPS: 25.0000 mg | ORAL_CAPSULE | ORAL | Status: DC | PRN

## 2019-11-29 MED ORDER — SODIUM CHLORIDE 0.9% FLUSH: 3.0000 mL | INTRAVENOUS | Status: DC | PRN

## 2019-11-29 MED ORDER — LACTATED RINGERS IV BOLUS: 1000.0000 mL | Freq: Once | INTRAVENOUS | Status: DC

## 2019-11-29 MED ORDER — NALOXONE HCL 4 MG/10ML IJ SOLN
1.0000 ug/kg/h | INTRAVENOUS | Status: DC | PRN
  Filled 2019-11-29: qty 5

## 2019-11-29 MED ORDER — MEPERIDINE HCL 25 MG/ML IJ SOLN: 6.2500 mg | INTRAMUSCULAR | Status: DC | PRN

## 2019-11-29 MED ORDER — OXYTOCIN 40 UNITS IN NORMAL SALINE INFUSION - SIMPLE MED
INTRAVENOUS | Status: DC | PRN
  Administered 2019-11-29: 40 [IU] via INTRAVENOUS

## 2019-11-29 MED ORDER — LACTATED RINGERS IV SOLN: INTRAVENOUS | Status: DC

## 2019-11-29 MED ORDER — DEXAMETHASONE SODIUM PHOSPHATE 4 MG/ML IJ SOLN
INTRAMUSCULAR | Status: DC | PRN
  Administered 2019-11-29: 4 mg via INTRAVENOUS

## 2019-11-29 MED ORDER — KETOROLAC TROMETHAMINE 30 MG/ML IJ SOLN
INTRAMUSCULAR | Status: AC
  Filled 2019-11-29: qty 1

## 2019-11-29 MED ORDER — CEFAZOLIN SODIUM-DEXTROSE 2-4 GM/100ML-% IV SOLN: 2.0000 g | INTRAVENOUS | Status: DC

## 2019-11-29 MED ORDER — CEFAZOLIN SODIUM-DEXTROSE 2-4 GM/100ML-% IV SOLN
INTRAVENOUS | Status: AC
  Filled 2019-11-29: qty 100

## 2019-11-29 MED ORDER — SODIUM CHLORIDE 0.9 % IV SOLN: INTRAVENOUS | Status: DC | PRN

## 2019-11-29 MED ORDER — SCOPOLAMINE 1 MG/3DAYS TD PT72
MEDICATED_PATCH | TRANSDERMAL | Status: AC
  Filled 2019-11-29: qty 1

## 2019-11-29 MED ORDER — KETOROLAC TROMETHAMINE 30 MG/ML IJ SOLN
30.0000 mg | Freq: Four times a day (QID) | INTRAMUSCULAR | Status: AC | PRN
  Administered 2019-11-29 – 2019-11-30 (×3): 30 mg via INTRAVENOUS
  Filled 2019-11-29 (×2): qty 1

## 2019-11-29 MED ORDER — CEFAZOLIN SODIUM-DEXTROSE 2-4 GM/100ML-% IV SOLN
2.0000 g | INTRAVENOUS | Status: AC
  Administered 2019-11-29: 21:00:00 2 g via INTRAVENOUS

## 2019-11-29 MED ORDER — FENTANYL CITRATE (PF) 100 MCG/2ML IJ SOLN: 25.0000 ug | INTRAMUSCULAR | Status: DC | PRN

## 2019-11-29 MED ORDER — DIPHENHYDRAMINE HCL 50 MG/ML IJ SOLN: 12.5000 mg | INTRAMUSCULAR | Status: DC | PRN

## 2019-11-29 MED ORDER — ONDANSETRON HCL 4 MG/2ML IJ SOLN: 4.0000 mg | Freq: Three times a day (TID) | INTRAMUSCULAR | Status: DC | PRN

## 2019-11-29 MED ORDER — SCOPOLAMINE 1 MG/3DAYS TD PT72
1.0000 | MEDICATED_PATCH | Freq: Once | TRANSDERMAL | Status: DC
  Administered 2019-11-29: 1.5 mg via TRANSDERMAL

## 2019-11-29 MED ORDER — ONDANSETRON HCL 4 MG/2ML IJ SOLN
INTRAMUSCULAR | Status: DC | PRN
  Administered 2019-11-29: 4 mg via INTRAVENOUS

## 2019-11-29 MED ORDER — FENTANYL CITRATE (PF) 100 MCG/2ML IJ SOLN
INTRAMUSCULAR | Status: DC | PRN
  Administered 2019-11-29: 15 ug via INTRATHECAL

## 2019-11-29 MED ORDER — FENTANYL CITRATE (PF) 100 MCG/2ML IJ SOLN
INTRAMUSCULAR | Status: AC
  Filled 2019-11-29: qty 2

## 2019-11-29 MED ORDER — PHENYLEPHRINE HCL-NACL 20-0.9 MG/250ML-% IV SOLN
INTRAVENOUS | Status: AC
  Filled 2019-11-29: qty 250

## 2019-11-29 MED ORDER — PHENYLEPHRINE HCL-NACL 20-0.9 MG/250ML-% IV SOLN
INTRAVENOUS | Status: DC | PRN
  Administered 2019-11-29: 60 ug/min via INTRAVENOUS

## 2019-11-29 MED ORDER — NALOXONE HCL 0.4 MG/ML IJ SOLN: 0.4000 mg | INTRAMUSCULAR | Status: DC | PRN

## 2019-11-29 MED ORDER — DEXAMETHASONE SODIUM PHOSPHATE 4 MG/ML IJ SOLN
INTRAMUSCULAR | Status: AC
  Filled 2019-11-29: qty 1

## 2019-11-29 MED ORDER — SOD CITRATE-CITRIC ACID 500-334 MG/5ML PO SOLN
30.0000 mL | Freq: Once | ORAL | Status: AC
  Administered 2019-11-29: 30 mL via ORAL

## 2019-11-29 MED ORDER — MORPHINE SULFATE (PF) 0.5 MG/ML IJ SOLN
INTRAMUSCULAR | Status: DC | PRN
  Administered 2019-11-29: .15 mg via INTRATHECAL

## 2019-11-29 MED ORDER — OXYTOCIN 40 UNITS IN NORMAL SALINE INFUSION - SIMPLE MED
INTRAVENOUS | Status: AC
  Filled 2019-11-29: qty 1000

## 2019-11-29 MED ORDER — SOD CITRATE-CITRIC ACID 500-334 MG/5ML PO SOLN: 30.0000 mL | Freq: Once | ORAL | Status: DC

## 2019-11-29 MED ORDER — FAMOTIDINE IN NACL 20-0.9 MG/50ML-% IV SOLN: 20.0000 mg | Freq: Once | INTRAVENOUS | Status: DC

## 2019-11-29 MED ORDER — ONDANSETRON HCL 4 MG/2ML IJ SOLN
INTRAMUSCULAR | Status: AC
  Filled 2019-11-29: qty 2

## 2019-11-29 SURGICAL SUPPLY — 31 items
BENZOIN TINCTURE PRP APPL 2/3 (GAUZE/BANDAGES/DRESSINGS) ×2 IMPLANT
CLOTH BEACON ORANGE TIMEOUT ST (SAFETY) ×2 IMPLANT
DRSG OPSITE POSTOP 4X10 (GAUZE/BANDAGES/DRESSINGS) ×2 IMPLANT
ELECT REM PT RETURN 9FT ADLT (ELECTROSURGICAL) ×2
ELECTRODE REM PT RTRN 9FT ADLT (ELECTROSURGICAL) ×1 IMPLANT
EXTRACTOR VACUUM KIWI (MISCELLANEOUS) ×2 IMPLANT
GLOVE BIO SURGEON STRL SZ 6.5 (GLOVE) ×2 IMPLANT
GLOVE BIOGEL PI IND STRL 7.0 (GLOVE) ×1 IMPLANT
GLOVE BIOGEL PI INDICATOR 7.0 (GLOVE) ×1
GOWN STRL REUS W/TWL LRG LVL3 (GOWN DISPOSABLE) ×4 IMPLANT
KIT ABG SYR 3ML LUER SLIP (SYRINGE) IMPLANT
NEEDLE HYPO 25X5/8 SAFETYGLIDE (NEEDLE) IMPLANT
NS IRRIG 1000ML POUR BTL (IV SOLUTION) ×2 IMPLANT
PACK C SECTION WH (CUSTOM PROCEDURE TRAY) ×2 IMPLANT
PAD OB MATERNITY 4.3X12.25 (PERSONAL CARE ITEMS) ×2 IMPLANT
PENCIL SMOKE EVAC W/HOLSTER (ELECTROSURGICAL) ×2 IMPLANT
RTRCTR C-SECT PINK 25CM LRG (MISCELLANEOUS) ×2 IMPLANT
STRIP CLOSURE SKIN 1/2X4 (GAUZE/BANDAGES/DRESSINGS) ×2 IMPLANT
SUT CHROMIC 1 CTX 36 (SUTURE) ×4 IMPLANT
SUT PLAIN 0 NONE (SUTURE) IMPLANT
SUT PLAIN 2 0 XLH (SUTURE) ×2 IMPLANT
SUT VIC AB 0 CT1 27 (SUTURE) ×2
SUT VIC AB 0 CT1 27XBRD ANBCTR (SUTURE) ×2 IMPLANT
SUT VIC AB 2-0 CT1 27 (SUTURE) ×1
SUT VIC AB 2-0 CT1 TAPERPNT 27 (SUTURE) ×1 IMPLANT
SUT VIC AB 3-0 CT1 27 (SUTURE) ×1
SUT VIC AB 3-0 CT1 TAPERPNT 27 (SUTURE) ×1 IMPLANT
SUT VIC AB 4-0 KS 27 (SUTURE) ×2 IMPLANT
TOWEL OR 17X24 6PK STRL BLUE (TOWEL DISPOSABLE) ×2 IMPLANT
TRAY FOLEY W/BAG SLVR 14FR LF (SET/KITS/TRAYS/PACK) ×2 IMPLANT
WATER STERILE IRR 1000ML POUR (IV SOLUTION) ×4 IMPLANT

## 2019-11-29 NOTE — Anesthesia Postprocedure Evaluation (Signed)
Anesthesia Post Note  Patient: Maryelizabeth Eberle  Procedure(s) Performed: CESAREAN SECTION WITH BILATERAL TUBAL LIGATION (Bilateral )     Patient location during evaluation: PACU Anesthesia Type: Spinal Level of consciousness: oriented and awake and alert Pain management: pain level controlled Vital Signs Assessment: post-procedure vital signs reviewed and stable Respiratory status: spontaneous breathing, respiratory function stable and nonlabored ventilation Cardiovascular status: blood pressure returned to baseline and stable Postop Assessment: no headache, no backache, no apparent nausea or vomiting, spinal receding and patient able to bend at knees Anesthetic complications: no    Last Vitals:  Vitals:   11/29/19 2245 11/29/19 2250  BP: (!) 143/98   Pulse: 100 97  Resp: (!) 22 (!) 23  Temp:    SpO2: 100% 100%    Last Pain:  Vitals:   11/29/19 2215  TempSrc: Oral   Pain Goal:      LLE Sensation: Tingling (11/29/19 2245)   RLE Sensation: Tingling (11/29/19 2245) L Sensory Level: T7-Lower costal margin (11/29/19 2245) R Sensory Level: T7-Lower costal margin (11/29/19 2245) Epidural/Spinal Function Cutaneous sensation: Tingles (11/29/19 2245), Patient able to flex knees: No (11/29/19 2245), Patient able to lift hips off bed: No (11/29/19 2245), Back pain beyond tenderness at insertion site: No (11/29/19 2245), Progressively worsening motor and/or sensory loss: No (11/29/19 2245)  Spiro Ausborn A.

## 2019-11-29 NOTE — Brief Op Note (Signed)
11/29/2019  10:13 PM  PATIENT:  Kerry Richards  29 y.o. female  PRE-OPERATIVE DIAGNOSIS:  Repeat cesarean section with bilateral tubal ligation by salpingectomy; pregnancy inducted hypertension, history of cesarean section, undesired fertility, declines trial of labor  POST-OPERATIVE DIAGNOSIS:  Repeat cesarean section with bilateral tubal ligation by salpingectomy; pregnancy inducted hypertension, history of cesarean section, undesired fertility, declines trial of labor  PROCEDURE:  Procedure(s) with comments: CESAREAN SECTION WITH BILATERAL TUBAL LIGATION (Bilateral) - Tracey RNFA  SURGEON:  Surgeon(s) and Role:    * Bovard-Stuckert, Taylar Hartsough, MD - Primary  ANESTHESIA:   spinal  EBL:  192cc uop and IVF per anesthesia, clear urine at end of procedure   FINDINGS: viable female infant at 20:52, apgars 8/9, wt 7#14, nl uterus, tubes and ovaries.    BLOOD ADMINISTERED:none  DRAINS: Urinary Catheter (Foley)   LOCAL MEDICATIONS USED:  NONE  SPECIMEN:  Source of Specimen:  Placenta and B tubal segments  DISPOSITION OF SPECIMEN:  L&D and pathology  COUNTS:  YES  TOURNIQUET:  * No tourniquets in log *  DICTATION: .Other Dictation: Dictation Number [id number]  PLAN OF CARE: Admit to inpatient   PATIENT DISPOSITION:  PACU - hemodynamically stable.   Delay start of Pharmacological VTE agent (>24hrs) due to surgical blood loss or risk of bleeding: not applicable

## 2019-11-29 NOTE — Anesthesia Preprocedure Evaluation (Signed)
Anesthesia Evaluation  Patient identified by MRN, date of birth, ID band Patient awake    Reviewed: Allergy & Precautions, NPO status , Patient's Chart, lab work & pertinent test results  Airway Mallampati: II  TM Distance: >3 FB Neck ROM: Full    Dental no notable dental hx. (+) Teeth Intact   Pulmonary Current Smoker,    Pulmonary exam normal breath sounds clear to auscultation       Cardiovascular hypertension, Normal cardiovascular exam Rhythm:Regular Rate:Normal     Neuro/Psych  Headaches, Anxiety    GI/Hepatic Neg liver ROS, GERD  Medicated and Controlled,  Endo/Other  Morbid obesity  Renal/GU negative Renal ROS  negative genitourinary   Musculoskeletal negative musculoskeletal ROS (+)   Abdominal (+) + obese,   Peds  Hematology negative hematology ROS (+)   Anesthesia Other Findings   Reproductive/Obstetrics (+) Pregnancy Previous C/Section Desires sterilization HSV Preterm labor                             Anesthesia Physical Anesthesia Plan  ASA: III  Anesthesia Plan: Spinal   Post-op Pain Management:    Induction:   PONV Risk Score and Plan: 3 and Scopolamine patch - Pre-op, Ondansetron and Treatment may vary due to age or medical condition  Airway Management Planned: Natural Airway  Additional Equipment:   Intra-op Plan:   Post-operative Plan:   Informed Consent: I have reviewed the patients History and Physical, chart, labs and discussed the procedure including the risks, benefits and alternatives for the proposed anesthesia with the patient or authorized representative who has indicated his/her understanding and acceptance.     Dental advisory given  Plan Discussed with: CRNA and Surgeon  Anesthesia Plan Comments:         Anesthesia Quick Evaluation

## 2019-11-29 NOTE — Anesthesia Procedure Notes (Signed)
Spinal  Patient location during procedure: OR Start time: 11/29/2019 8:30 PM End time: 11/29/2019 8:35 PM Staffing Anesthesiologist: Mal Amabile, MD Preanesthetic Checklist Completed: patient identified, IV checked, site marked, risks and benefits discussed, surgical consent, monitors and equipment checked, pre-op evaluation and timeout performed Spinal Block Patient position: sitting Prep: DuraPrep and site prepped and draped Patient monitoring: heart rate, cardiac monitor, continuous pulse ox and blood pressure Approach: midline Location: L4-5 Injection technique: single-shot Needle Needle type: Pencan  Needle gauge: 24 G Needle length: 9 cm Needle insertion depth: 7 cm Assessment Sensory level: T3 Additional Notes Patient tolerated procedure well. Adequate sensory level.

## 2019-11-29 NOTE — H&P (Signed)
Kerry Richards is a 29 y.o. female [phone] at 94+2 with CHTN, worsening despite Procardia and HA.  BP in office 155-160/100-110) Pregnancy dated by early [place]. Pt w GBBS UTI in early pregnancy.  Also h/o genital herpes and anxiety.  Received [facility] in [facility].  D/W pt r/b/a of rLTCS and BTL.  Will proceed after labs, when 6hr after last meal.    OB History    Gravida  4   Para  1   Term  1   Preterm  0   AB  2   Living  1     SAB  1   TAB  1   Ectopic      Multiple      Live Births  1         SAB, TAB Last pap 9/20 - ASCUS HR HPV+ H/o trich and HSV 8#2 female LTCS, srrest of descent G4 current  Past Medical History:  Diagnosis Date  . Anxiety   . Hx of pyelonephritis   . Hypertension   . Migraine   . Trichomonas contact, treated   . Vaginal Pap smear, abnormal    Past Surgical History:  Procedure Laterality Date  . CESAREAN SECTION  08/24/2012   Procedure: CESAREAN SECTION;  Surgeon: Countess Pila, MD;  Location: [facility];  Service: Obstetrics;  Laterality: N/A;  Primary Cesarean Section Delivery Baby  Boy @ 0004, Apgars 9/9  . DILATION AND CURETTAGE OF UTERUS    . TOOTH EXTRACTION    . WISDOM TOOTH EXTRACTION     Family History: family history includes Arthritis in her maternal aunt and maternal grandmother; Cancer in her maternal grandmother; Depression in her paternal uncle; Diabetes in her paternal grandfather; Heart attack in her paternal aunt and paternal grandfather; Heart disease in her paternal aunt and paternal grandfather; Hypertension in her father, maternal aunt, and paternal grandfather; Migraines in her father; Stroke in her father; Suicidality in her paternal uncle. Social History:  reports that she has been smoking cigarettes. She has a 7.00 pack-year smoking history. She has never used smokeless tobacco. She reports current alcohol use of about 3.0 standard drinks of alcohol per week. She reports current drug use. Drug: Marijuana. engaged, cook at  [employer] Protonix, Procardia 60, Valtrex and PNV Al NKDA     Maternal Diabetes: No Genetic Screening: Normal Maternal Ultrasounds/Referrals: Normal Fetal Ultrasounds or other Referrals:  None Maternal Substance Abuse:  No (MJ per [facility]) Significant Maternal Medications:  None Significant Maternal Lab Results:  Group B Strep positive and Rh negative Other Comments:  polyhydramnios  Review of Systems  Constitutional: Negative.   HENT: Negative.   Eyes: Negative.   Respiratory: Negative.   Cardiovascular: Negative.   Gastrointestinal: Negative.   Genitourinary: Negative.   Musculoskeletal: Negative.   Skin: Negative.   Neurological: Positive for headaches.  Psychiatric/Behavioral: Negative.    Maternal Medical History:  Contractions: Frequency: irregular.    Fetal activity: Perceived fetal activity is normal.    Prenatal complications: PIH.   Prenatal Complications - Diabetes: none.      Blood pressure (!) 150/87, temperature 98.5 F (36.9 C), temperature source Oral, resp. rate 18, height 5\' 3"  (1.6 m), weight 114.8 kg, last menstrual period 03/19/2019. Maternal Exam:  Uterine Assessment: Contraction frequency is irregular.   Abdomen: Patient reports no abdominal tenderness. Surgical scars: low transverse.   Fundal height is appropriate for gestation.   Estimated fetal weight is 8-9#.   Fetal  presentation: vertex  Introitus: Normal vulva. Normal vagina.    Physical Exam  Constitutional: She is oriented to person, place, and time. She appears well-developed and well-nourished.  HENT:  Head: Normocephalic and atraumatic.  Cardiovascular: Normal rate and regular rhythm.  Respiratory: Effort normal and breath sounds normal. No respiratory distress. She has no wheezes.  GI: Soft. Bowel sounds are normal. She exhibits no distension. There is no abdominal tenderness.  Genitourinary:    Vulva normal.   Musculoskeletal:        General: Normal  range of motion.  Neurological: She is alert and oriented to person, place, and time.  Skin: Skin is warm and dry.  Psychiatric: She has a normal mood and affect. Her behavior is normal.    Prenatal labs: ABO, Rh: A/Positive/-- (09/21 0000) Antibody: Negative (09/21 0000) Rubella: Immune (09/21 0000) RPR: Nonreactive (09/21 0000)  HBsAg: Negative (09/21 0000)  HIV: Non-reactive, negative (09/21 0000)  GBS: Positive/-- (09/21 0000)   Tdao 10/08/19  Dated by 10wk [place] Hgb 13.5/Plt 307/Ur Cx +GBBS/GC neg/Chl neg/Varicella immune/Hgb electro WNL/Glucola 127/panorama low risk/ [place] nl anat Assessment/Plan: 28yo [id number] at 37+ with CHTN, uncontrolled BP on Procardia D/w pt r/b/a of LTCS and BTL Ancef for prophylaxis   Kerry Richards 11/29/2019, 7:02 PM

## 2019-11-29 NOTE — Transfer of Care (Signed)
Immediate Anesthesia Transfer of Care Note  Patient: Kerry Richards  Procedure(s) Performed: CESAREAN SECTION WITH BILATERAL TUBAL LIGATION (Bilateral )  Patient Location: PACU  Anesthesia Type:Spinal  Level of Consciousness: awake, alert  and patient cooperative  Airway & Oxygen Therapy: Patient Spontanous Breathing  Post-op Assessment: Report given to RN and Post -op Vital signs reviewed and stable  Post vital signs: Reviewed and stable  Last Vitals:  Vitals Value Taken Time  BP 156/76 11/29/19 2140  Temp    Pulse 102 11/29/19 2143  Resp 20 11/29/19 2143  SpO2 100 % 11/29/19 2143  Vitals shown include unvalidated device data.  Last Pain:  Vitals:   11/29/19 1846  TempSrc: Oral         Complications: No apparent anesthesia complications

## 2019-11-30 ENCOUNTER — Encounter (HOSPITAL_COMMUNITY): Payer: Self-pay | Admitting: Obstetrics and Gynecology

## 2019-11-30 LAB — RPR: RPR Ser Ql: NONREACTIVE

## 2019-11-30 MED ORDER — IBUPROFEN 800 MG PO TABS
800.0000 mg | ORAL_TABLET | Freq: Three times a day (TID) | ORAL | Status: DC
  Administered 2019-12-01 – 2019-12-02 (×4): 800 mg via ORAL
  Filled 2019-11-30 (×4): qty 1

## 2019-11-30 MED ORDER — OXYTOCIN 40 UNITS IN NORMAL SALINE INFUSION - SIMPLE MED: 2.5000 [IU]/h | INTRAVENOUS | Status: AC

## 2019-11-30 MED ORDER — METHYLERGONOVINE MALEATE 0.2 MG/ML IJ SOLN: 0.2000 mg | INTRAMUSCULAR | Status: DC | PRN

## 2019-11-30 MED ORDER — SIMETHICONE 80 MG PO CHEW
80.0000 mg | CHEWABLE_TABLET | ORAL | Status: DC | PRN
  Administered 2019-11-30 – 2019-12-01 (×3): 80 mg via ORAL
  Filled 2019-11-30 (×3): qty 1

## 2019-11-30 MED ORDER — LACTATED RINGERS IV SOLN: INTRAVENOUS | Status: DC

## 2019-11-30 MED ORDER — ZOLPIDEM TARTRATE 5 MG PO TABS: 5.0000 mg | ORAL_TABLET | Freq: Every evening | ORAL | Status: DC | PRN

## 2019-11-30 MED ORDER — OXYCODONE HCL 5 MG PO TABS
5.0000 mg | ORAL_TABLET | ORAL | Status: DC | PRN
  Administered 2019-11-30: 5 mg via ORAL
  Administered 2019-11-30: 10 mg via ORAL
  Administered 2019-12-01 (×2): 5 mg via ORAL
  Filled 2019-11-30: qty 2
  Filled 2019-11-30 (×3): qty 1

## 2019-11-30 MED ORDER — WITCH HAZEL-GLYCERIN EX PADS: 1.0000 "application " | MEDICATED_PAD | CUTANEOUS | Status: DC | PRN

## 2019-11-30 MED ORDER — COCONUT OIL OIL
1.0000 "application " | TOPICAL_OIL | Status: DC | PRN
  Administered 2019-12-01: 1 via TOPICAL

## 2019-11-30 MED ORDER — DIBUCAINE (PERIANAL) 1 % EX OINT: 1.0000 "application " | TOPICAL_OINTMENT | CUTANEOUS | Status: DC | PRN

## 2019-11-30 MED ORDER — PRENATAL MULTIVITAMIN CH
1.0000 | ORAL_TABLET | Freq: Every day | ORAL | Status: DC
  Administered 2019-11-30 – 2019-12-01 (×2): 1 via ORAL
  Filled 2019-11-30 (×2): qty 1

## 2019-11-30 MED ORDER — KETOROLAC TROMETHAMINE 30 MG/ML IJ SOLN
30.0000 mg | Freq: Four times a day (QID) | INTRAMUSCULAR | Status: AC
  Administered 2019-11-30: 30 mg via INTRAVENOUS
  Filled 2019-11-30: qty 1

## 2019-11-30 MED ORDER — TETANUS-DIPHTH-ACELL PERTUSSIS 5-2.5-18.5 LF-MCG/0.5 IM SUSP: 0.5000 mL | Freq: Once | INTRAMUSCULAR | Status: DC

## 2019-11-30 MED ORDER — MAGNESIUM HYDROXIDE 400 MG/5ML PO SUSP: 30.0000 mL | ORAL | Status: DC | PRN

## 2019-11-30 MED ORDER — FAMOTIDINE 20 MG PO TABS
20.0000 mg | ORAL_TABLET | Freq: Two times a day (BID) | ORAL | Status: DC
  Administered 2019-12-01: 20 mg via ORAL
  Filled 2019-11-30 (×2): qty 1

## 2019-11-30 MED ORDER — MEASLES, MUMPS & RUBELLA VAC IJ SOLR: 0.5000 mL | Freq: Once | INTRAMUSCULAR | Status: DC

## 2019-11-30 MED ORDER — SENNOSIDES-DOCUSATE SODIUM 8.6-50 MG PO TABS
2.0000 | ORAL_TABLET | ORAL | Status: DC
  Administered 2019-12-01 (×2): 2 via ORAL
  Filled 2019-11-30 (×2): qty 2

## 2019-11-30 MED ORDER — MENTHOL 3 MG MT LOZG: 1.0000 | LOZENGE | OROMUCOSAL | Status: DC | PRN

## 2019-11-30 MED ORDER — ACETAMINOPHEN 500 MG PO TABS
1000.0000 mg | ORAL_TABLET | Freq: Four times a day (QID) | ORAL | Status: DC
  Administered 2019-11-30 – 2019-12-02 (×7): 1000 mg via ORAL
  Filled 2019-11-30 (×7): qty 2

## 2019-11-30 MED ORDER — DIPHENHYDRAMINE HCL 25 MG PO CAPS: 25.0000 mg | ORAL_CAPSULE | Freq: Four times a day (QID) | ORAL | Status: DC | PRN

## 2019-11-30 MED ORDER — METHYLERGONOVINE MALEATE 0.2 MG PO TABS: 0.2000 mg | ORAL_TABLET | ORAL | Status: DC | PRN

## 2019-11-30 NOTE — Progress Notes (Signed)
Subjective: Postpartum Day #1: Cesarean Delivery Patient reports incisional pain and tolerating PO.    Objective: Vital signs in last 24 hours: Temp:  [98.3 F (36.8 C)-100 F (37.8 C)] 98.3 F (36.8 C) (03/19 0545) Pulse Rate:  [73-107] 73 (03/19 0545) Resp:  [14-32] 20 (03/19 0545) BP: (114-157)/(66-98) 116/70 (03/19 0545) SpO2:  [98 %-100 %] 99 % (03/19 0545) Weight:  [114.8 kg] 114.8 kg (03/18 1845)  Physical Exam:  General: alert Lochia: appropriate Uterine Fundus: firm Incision: dressing C/D/I   Recent Labs    11/29/19 1858  HGB 12.2  HCT 36.9    Assessment/Plan: Status post Cesarean section. Doing well postoperatively.  Continue current care, ambulate.  Will plan for circumcision tomorrow.  Kerry Richards 11/30/2019, 8:46 AM

## 2019-11-30 NOTE — Op Note (Signed)
NAMEHARLEM, THRESHER MEDICAL RECORD [id number] ACCOUNT [id number] DATE OF BIRTH:[birth year] FACILITY: [facility] LOCATION: [facility] PHYSICIAN:Loeta Herst BOVARD-STUCKERT, MD  OPERATIVE REPORT  DATE OF PROCEDURE:  11/29/2019  PREOPERATIVE DIAGNOSES:  Worsening chronic hypertension at term, history of low transverse cesarean section, declines trial of labor, undesired fertility.  POSTOPERATIVE DIAGNOSES:   Worsening chronic hypertension at term, history of low transverse cesarean section, declines trial of labor, undesired fertility, delivered.  PROCEDURE:  Repeat low transverse cesarean section with bilateral tubal ligation by salpingectomy.  SURGEON:  Sherron Monday, MD   ANESTHESIA:  Spinal.  ESTIMATED BLOOD LOSS:  192 mL.  URINE OUTPUT AND INTRAVENOUS FLUIDS:  Per anesthesia.  Clear urine at the end of the procedure.  FINDINGS:  Viable female infant at 2052, named Cassius.  Apgars of 8 at 1 minute and 9 at 5 minutes and weight of 7 pounds 14 ounces.  Normal uterus, tubes and ovaries are noted.     COMPLICATIONS:  None.  PATHOLOGY:  Placenta to L and D.  Bilateral tubal segments to pathology.  DESCRIPTION OF PROCEDURE:  After informed consent was reviewed with the patient.  She was transported to the operating room where spinal anesthesia was placed and found to be adequate.  She was then returned to the supine position with a leftward tilt,  prepped and draped in normal sterile fashion.  After an appropriate timeout was performed a Pfannenstiel skin incision was made at the level of her previous incision and carried through the underlying layer of fascia sharply.  The fascia was incised in  the midline and incision was extended laterally with Mayo scissors.  Superior aspect of the fascial incision was grasped with Kocher clamps, elevated and the rectus muscles were dissected off both bluntly and sharply.  Midline was easily identified and  the peritoneum was entered bluntly.  This incision was  extended superiorly and inferiorly with good visualization of the bladder.  Alexis skin retractor was placed carefully making sure that no bowel was entrapped.  A bladder flap was created both  digitally and sharply.  Uterus was incised in a transverse fashion.  The infant was delivered with the aid of a vacuum from a vertex presentation.  Nose and mouth were suctioned on the field.  Cord was clamped and cut.  Infant was handed off to the  waiting pediatric staff after a minute during which time the baby was dried and otherwise suctioned and stimulated.  The placenta was expressed from the uterus.  Uterus was cleared of all clot and debris.  The uterine incision was closed with 2 layers of  0 chromic.  The incision was noted to be hemostatic.  The adnexa were explored.  Both tubes were identified and followed out to the fimbriated end, isolated using a Kelly, excised and doubly ligated with plain gut.  This was noted to be hemostatic.  The  peritoneum was reapproximated with 2-0 Vicryl in a running fashion.  The subfascial planes were inspected and found to be hemostatic.  The fascia was reapproximated with 0 Vicryl in a running fashion in a single suture.  Subcuticular adipose layer was  made hemostatic with Bovie cautery and the dead space was closed with plain gut suture.  The subcuticular adipose layer was made hemostatic and subcutaneously closed with 4-0 Vicryl on a Keith needle.  Benzoin and Steri-Strips were applied.  The patient  tolerated the procedure well.  Sponge, lap, and needle count was correct x2 per the operating staff at the end of  the procedure.  CN/NUANCE  D:11/29/2019 T:11/30/2019 JOB:[id number]

## 2019-11-30 NOTE — Progress Notes (Signed)
Pt had output in foley catheter after 4 hours, urine was amber colored and concentrated.  Informed Dr Jackelyn Knife of current urine output, ordered to remove foley catheter now and encourage patient to push fluids.  Will continue to monitor.

## 2019-11-30 NOTE — Lactation Note (Signed)
This note was copied from a baby's chart. Lactation Consultation Note  Patient Name: Kerry Richards EQAST'M Date: 11/30/2019 Reason for consult: Initial assessment;Early term 37-38.6wks;Infant weight loss;Other (Comment)(NICU transient)  Visited with mom of a 24 hours old ETI female, baby had to go to the NICU yesterday due to respiratory distress. Mom is a P2 and she BF her first baby only for 2 weeks but it was exclusively pumping and bottle feeding. Mom started pumping but still hasn't gotten volume, just drops. Praised her for her efforts and explained to her that pumping early on is mainly for breast stimulation and not to get volume. Parents have been offering colostrum drops to baby in the meantime, they're been using the snappies to store them.  Mom participated in the [facility] program at the [facility] and she's already familiar with hand expression. Dad mentioned that baby likes the bottle "better" because when they try to latch, baby would only suck for a minute or two, but when he takes the bottle "he's going to town". Parents using a slow flow nipples, explained the differences between flow at the breast Vs. the bottle. Mom has a DEBP at home  Mom pumping with hospital grade DEBP when entering the room. Offered assistance with latch but mom politely declined and told LC that baby just fed Similac 20 calorie formula. Asked mom to call for assistance when needed. Reviewed normal newborn behavior, benefits of STS care, cluster feeding and feeding cues.  Feeding plan:  1. Encouraged mom to keep taking baby to breast 8-12 times/24 hours or sooner if feeding cues are present 2. She'll pump afterwards whenever baby is getting formula 3. Parents will continue supplementing baby with Similac 20 calorie formula after feedings/attempts at the breast 4. Mom will use her EBM first prior using any formula  BF brochure, BF resources and feeding diary were reviewed. Parents reported all questions and concerns  were answered, they're both aware of LC OP services and will call PRN.   Maternal Data Formula Feeding for Exclusion: Yes Reason for exclusion: Mother's choice to formula and breast feed on admission Has patient been taught Hand Expression?: Yes Does the patient have breastfeeding experience prior to this delivery?: Yes  Feeding Feeding Type: Breast Fed  LATCH Score                   Interventions Interventions: Breast feeding basics reviewed;DEBP  Lactation Tools Discussed/Used Tools: Pump Breast pump type: Double-Electric Breast Pump WIC Program: Yes Pump Review: Setup, frequency, and cleaning Initiated by:: RN Date initiated:: 11/29/19   Consult Status Consult Status: Follow-up Date: 12/01/19 Follow-up type: In-patient    Porshea Janowski Venetia Constable 11/30/2019, 7:10 PM

## 2019-11-30 NOTE — Lactation Note (Signed)
This note was copied from a baby's chart. Lactation Consultation Note  Patient Name: Kerry Richards Today's Date: 11/30/2019  P1, 8 hour , ETI female infant in NICU. Mom with hx: GHTN, C/S and HSV. Per RN, mom is in NICU when [facility] was entering room. RN has set mom up with DEBP and mom knows to pump every 3 hours for 15 minutes to help establish milk supply. Mom has labels to take EBM to NICU, mom has EBM that she has taken.  Mom will be followed up by morning LC services.    Maternal Data    Feeding Feeding Type: Formula  LATCH Score                   Interventions    Lactation Tools Discussed/Used     Consult Status      Danelle Earthly 11/30/2019, 4:53 AM

## 2019-12-01 MED ORDER — ONDANSETRON 4 MG PO TBDP
4.0000 mg | ORAL_TABLET | Freq: Three times a day (TID) | ORAL | Status: DC | PRN
  Administered 2019-12-01: 4 mg via ORAL

## 2019-12-01 MED ORDER — ONDANSETRON 4 MG PO TBDP
ORAL_TABLET | ORAL | Status: AC
  Filled 2019-12-01: qty 1

## 2019-12-01 NOTE — Progress Notes (Addendum)
Pt called nurse into room and stated she had sudden onset of diarrhea and had vomited twice. Partially undigested food noted in trashcan. Pt and FOB state they ate dinner at 2200 and pt had fish sandwich and fries from [employer]. Dr. Jackelyn Knife notified and new orders for zofran given. Pt assisted back to bed

## 2019-12-01 NOTE — Progress Notes (Signed)
POD #2 LTCS Has had n/v/d overnight, pain overall tolerable Afeb, VSS, all BP normal except last one 147/78 Abd- soft, fundus firm, some ecchymosis superior to incision, incision intact Will continue routine care, BP ok off meds, will do circ in the office

## 2019-12-01 NOTE — Progress Notes (Signed)
Pt oob to shower. Will return for VS

## 2019-12-01 NOTE — Progress Notes (Signed)
Nurse entered room at [phone] to obtain VS. After VS obtained, pt began groaning and saying she had to use the bathroom immediately; nurse helped pt to washroom where pt began producing emesis into trash can while sitting on the toilet. Pt produced a small amount of bile while in the bathroom and was able to return to bed shortly after. Pt was given heating pads and blankets d/t complaint of feeling cold because she was wearing only mesh underwear.   Hospital bed locked in lowest position and encouraged to use call bell if nausea returns or if she needs to get out of bed; pt receptive. Significant other at bedside.  Pt's primary nurse notified of episode; continuing to monitor for safety.

## 2019-12-02 LAB — CBC
HCT: 28.4 % — ABNORMAL LOW (ref 36.0–46.0)
Hemoglobin: 9.3 g/dL — ABNORMAL LOW (ref 12.0–15.0)
MCH: 29.3 pg (ref 26.0–34.0)
MCHC: 32.7 g/dL (ref 30.0–36.0)
MCV: 89.6 fL (ref 80.0–100.0)
Platelets: 234 10*3/uL (ref 150–400)
RBC: 3.17 MIL/uL — ABNORMAL LOW (ref 3.87–5.11)
RDW: 12.9 % (ref 11.5–15.5)
WBC: 9.8 10*3/uL (ref 4.0–10.5)
nRBC: 0 % (ref 0.0–0.2)

## 2019-12-02 MED ORDER — OXYCODONE HCL 5 MG PO TABS: 5.0000 mg | ORAL_TABLET | ORAL | 0 refills | Status: DC | PRN

## 2019-12-02 MED ORDER — IBUPROFEN 800 MG PO TABS: 800.0000 mg | ORAL_TABLET | Freq: Three times a day (TID) | ORAL | 0 refills | Status: DC

## 2019-12-02 NOTE — Discharge Instructions (Signed)
As per discharge pamphlet °

## 2019-12-02 NOTE — Progress Notes (Addendum)
POD #3 LTCS Feeling better today, wants to go home Afeb, VSS, BP 130-140/70-90 Abd- soft, fundus firm, stable ecchymosis, incision intact Discharge home today, BP is ok on no meds for now

## 2019-12-02 NOTE — Discharge Summary (Signed)
OB Discharge Summary     Patient Name: Joette Schmoker DOB: 07/16/1991 MRN: [id number]  Date of admission: 11/29/2019 Delivering MD: Janyth Contes   Date of discharge: 12/02/2019  Admitting diagnosis: Status post repeat low transverse cesarean section [Z98.891] Intrauterine pregnancy: [redacted]w[redacted]d     Secondary diagnosis:  Principal Problem:   Status post repeat low transverse cesarean section      Discharge diagnosis: Term Pregnancy Delivered and [facility] course:  Sceduled C/S   29 y.o. yo [id number] at [redacted]w[redacted]d was admitted to the hospital 11/29/2019 for scheduled cesarean section with the following indication:Elective Repeat.  Membrane Rupture Time/Date: 8:51 PM ,11/29/2019   Patient delivered a Viable infant.11/29/2019  Details of operation can be found in separate operative note.  Pateint had an uncomplicated postpartum course.  She is ambulating, tolerating a regular diet, passing flatus, and urinating well. Patient is discharged home in stable condition on  12/02/19         Physical exam  Vitals:   12/01/19 1500 12/01/19 1941 12/01/19 2101 12/02/19 0521  BP: 138/88 (!) 148/97 140/83 (!) 145/72  Pulse: 70 84 85 91  Resp: 18 17 18 17   Temp: 98.4 F (36.9 C) 99 F (37.2 C) 98.9 F (37.2 C) 97.7 F (36.5 C)  TempSrc: Oral Oral Oral Oral  SpO2:  100% 98% 100%  Weight:      Height:       General: alert Lochia: appropriate Uterine Fundus: firm Incision: Healing well with no significant drainage  Labs: Lab Results  Component Value Date   WBC 9.8 12/01/2019   HGB 9.3 (L) 12/01/2019   HCT 28.4 (L) 12/01/2019   MCV 89.6 12/01/2019   PLT 234 12/01/2019   CMP Latest Ref Rng & Units 11/29/2019  Glucose 70 - 99 mg/dL 84  BUN 6 - 20 mg/dL 8  Creatinine 0.44 - 1.00 mg/dL 0.60  Sodium 135 - 145 mmol/L 136  Potassium 3.5 - 5.1 mmol/L 4.0  Chloride 98 - 111 mmol/L 105  CO2 22 -  32 mmol/L 21(L)  Calcium 8.9 - 10.3 mg/dL 8.5(L)  Total Protein 6.5 - 8.1 g/dL 7.0  Total Bilirubin 0.3 - 1.2 mg/dL 0.4  Alkaline Phos 38 - 126 U/L 164(H)  AST 15 - 41 U/L 15  ALT 0 - 44 U/L 12    Discharge instruction: per After Visit Summary and "Baby and Me Booklet".  After visit meds:  Allergies as of 12/02/2019      Reactions   Metronidazole Hives   Shellfish Allergy Anaphylaxis   Amitriptyline Other (See Comments)   Nightmares      Medication List    STOP taking these medications   NIFEdipine 60 MG 24 hr tablet Commonly known as: PROCARDIA XL/NIFEDICAL XL   valACYclovir 1000 MG tablet Commonly known as: VALTREX     TAKE these medications   acetaminophen 500 MG tablet Commonly known as: TYLENOL Take 1,000 mg by mouth  every 6 (six) hours as needed for mild pain or headache.   ibuprofen 800 MG tablet Commonly known as: ADVIL Take 1 tablet (800 mg total) by mouth every 8 (eight) hours.   oxyCODONE 5 MG immediate release tablet Commonly known as: Oxy IR/ROXICODONE Take 1 tablet (5 mg total) by mouth every 4 (four) hours as needed for severe pain.   pantoprazole 40 MG tablet Commonly known as: PROTONIX Take 40 mg by mouth daily.   polyethylene glycol 17 g packet Commonly known as: MIRALAX / GLYCOLAX Take 17 g by mouth daily as needed for mild constipation.       Diet: routine diet  Activity: Advance as tolerated. Pelvic rest for 6 weeks.   Outpatient follow up:3 days for BP check  Postpartum contraception: Tubal Ligation  Newborn Data: Live born female  Birth Weight: 7 lb 14.8 oz (3595 g) APGAR: 8, 9  Newborn Delivery   Birth date/time: 11/29/2019 20:52:00 Delivery type: C-Section, Low Transverse Trial of labor: No C-section categorization: Repeat      Baby Feeding: Breast Disposition:home with mother   12/02/2019 Zenaida Niece, MD

## 2019-12-03 LAB — SURGICAL PATHOLOGY

## 2019-12-09 ENCOUNTER — Other Ambulatory Visit (HOSPITAL_COMMUNITY)
Admission: RE | Admit: 2019-12-09 | Discharge: 2019-12-09 | Disposition: A | Discharge status: Home / Self Care | Payer: Medicaid Other | Source: Ambulatory Visit | Attending: Obstetrics and Gynecology | Admitting: Obstetrics and Gynecology

## 2019-12-09 HISTORY — DX: Unspecified abnormal cytological findings in specimens from vagina: R87.629

## 2021-02-19 ENCOUNTER — Ambulatory Visit: Payer: Medicaid Other | Admitting: Nurse Practitioner

## 2021-03-02 ENCOUNTER — Other Ambulatory Visit: Payer: Self-pay

## 2021-03-02 ENCOUNTER — Encounter: Payer: Self-pay | Admitting: Nurse Practitioner

## 2021-03-02 ENCOUNTER — Ambulatory Visit (INDEPENDENT_AMBULATORY_CARE_PROVIDER_SITE_OTHER): Payer: Medicaid Other | Admitting: Nurse Practitioner

## 2021-03-02 VITALS — BP 132/82 | HR 92 | Temp 98.4°F | Ht 62.0 in | Wt 235.5 lb

## 2021-03-02 DIAGNOSIS — G43109 Migraine with aura, not intractable, without status migrainosus: Secondary | ICD-10-CM

## 2021-03-02 DIAGNOSIS — I499 Cardiac arrhythmia, unspecified: Secondary | ICD-10-CM

## 2021-03-02 DIAGNOSIS — Z0000 Encounter for general adult medical examination without abnormal findings: Secondary | ICD-10-CM

## 2021-03-02 DIAGNOSIS — I10 Essential (primary) hypertension: Secondary | ICD-10-CM | POA: Diagnosis not present

## 2021-03-02 MED ORDER — PROPRANOLOL HCL ER 60 MG PO CP24: 60.0000 mg | ORAL_CAPSULE | Freq: Every day | ORAL | 1 refills | Status: DC

## 2021-03-02 NOTE — Progress Notes (Signed)
Established Patient Office Visit  Subjective:  Patient ID: Kerry Richards, female    DOB: August 28, 1991  Age: 30 y.o. MRN: [id number]  CC:  Chief Complaint  Patient presents with   Medication Refill   Blood Pressure Check    HPI Kerry Richards presents for evaluation of blood pressure. She states that she has "spells" approximately every other day when she feels heart palpitations. She states that during these episodes, she states that her ears ring and she sees stars or lights. When this happens, she has to sit down, take deep breaths, and wait until the symptoms resolve on their own. Usually takes several minutes. She states that she has been having these episodes since she was about 30 years old. They are started to get more frequent and last for a longer period of time. She states that after she has one of these episodes, she does get a headache. During her most recet pregnancy, she took nifedipine. This seemed to control the episodes and her blood pressure. States that after she had her son, she continued to take the nifedipine. She states that it started to make her feel swollen and bloated. Nifedipine was stopped and trial of HCTZ was started. She states that this medication made her urinate more often but did not help to control the episodes. She states that she has not taken either of these medications for several months. She is concerned as her father and grandfather have both had strokes in recent past. The patient states that she has quit smoking. She states that her last cigarette was about 5 months ago.  She is due to have check of labs. Will include thyroid panel with labs.   Past Medical History:  Diagnosis Date   Anxiety    Hx of pyelonephritis    Hypertension    Migraine    Status post repeat low transverse cesarean section 11/29/2019   Trichomonas contact, treated    Vaginal Pap smear, abnormal     Past Surgical History:  Procedure Laterality Date   CESAREAN SECTION   08/24/2012   Procedure: CESAREAN SECTION;  Surgeon: Lenderman Pila, MD;  Location: [facility];  Service: Obstetrics;  Laterality: N/A;  Primary Cesarean Section Delivery Baby  Boy @ 0004, Apgars 9/9   CESAREAN SECTION WITH BILATERAL TUBAL LIGATION Bilateral 11/29/2019   Procedure: CESAREAN SECTION WITH BILATERAL TUBAL LIGATION;  Surgeon: Sherian Rein, MD;  Location: [facility];  Service: Obstetrics;  Laterality: Bilateral;  Tracey RNFA   DILATION AND CURETTAGE OF UTERUS     TOOTH EXTRACTION     WISDOM TOOTH EXTRACTION      Family History  Problem Relation Age of Onset   Hypertension Father    Migraines Father    Stroke Father    Arthritis Maternal Aunt    Hypertension Maternal Aunt    Arthritis Maternal Grandmother    Cancer Maternal Grandmother        lung   Diabetes Paternal Grandfather    Heart disease Paternal Grandfather    Hypertension Paternal Grandfather    Heart attack Paternal Grandfather    Heart disease Paternal Aunt    Heart attack Paternal Aunt    Depression Paternal Uncle    Suicidality Paternal Uncle    Anesthesia problems Neg Hx    Other Neg Hx     Social History   Socioeconomic History   Marital status: Single    Spouse name: Not on file   Number of children: Not on  file   Years of education: Not on file   Highest education level: Not on file  Occupational History   Not on file  Tobacco Use   Smoking status: Every Day    Packs/day: 0.50    Years: 14.00    Pack years: 7.00    Types: Cigarettes    Last attempt to quit: 09/30/2011    Years since quitting: 9.4   Smokeless tobacco: Never  Vaping Use   Vaping Use: Never used  Substance and Sexual Activity   Alcohol use: Yes    Alcohol/week: 3.0 standard drinks    Types: 3 Shots of liquor per week   Drug use: Yes    Types: Marijuana   Sexual activity: Yes    Birth control/protection: None  Other Topics Concern   Not on file  Social History Narrative   Not on file   Social Determinants  of Health   Financial Resource Strain: Not on file  Food Insecurity: Not on file  Transportation Needs: Not on file  Physical Activity: Not on file  Stress: Not on file  Social Connections: Not on file  Intimate Partner Violence: Not on file    Outpatient Medications Prior to Visit  Medication Sig Dispense Refill   acetaminophen (TYLENOL) 500 MG tablet Take 1,000 mg by mouth every 6 (six) hours as needed for mild pain or headache. (Patient not taking: Reported on 03/02/2021)     ibuprofen (ADVIL) 800 MG tablet Take 1 tablet (800 mg total) by mouth every 8 (eight) hours. (Patient not taking: Reported on 03/02/2021) 30 tablet 0   oxyCODONE (OXY IR/ROXICODONE) 5 MG immediate release tablet Take 1 tablet (5 mg total) by mouth every 4 (four) hours as needed for severe pain. (Patient not taking: Reported on 03/02/2021) 15 tablet 0   pantoprazole (PROTONIX) 40 MG tablet Take 40 mg by mouth daily. (Patient not taking: Reported on 03/02/2021)     polyethylene glycol (MIRALAX / GLYCOLAX) 17 g packet Take 17 g by mouth daily as needed for mild constipation. (Patient not taking: Reported on 03/02/2021)     No facility-administered medications prior to visit.    Allergies  Allergen Reactions   Metronidazole Hives   Shellfish Allergy Anaphylaxis   Amitriptyline Other (See Comments)    Nightmares     ROS Review of Systems  Constitutional:  Positive for fatigue. Negative for activity change, chills and fever.  HENT:  Negative for congestion, postnasal drip, rhinorrhea and sinus pressure.   Eyes: Negative.   Respiratory:  Positive for chest tightness and shortness of breath. Negative for cough and wheezing.        She has these symptoms when episodes occur.   Cardiovascular:  Positive for palpitations. Negative for chest pain.       Elevated blood pressure   Gastrointestinal:  Positive for nausea. Negative for constipation, diarrhea and vomiting.  Endocrine: Negative for cold intolerance, heat  intolerance, polydipsia and polyuria.  Musculoskeletal:  Negative for back pain and myalgias.  Skin:  Negative for rash.  Allergic/Immunologic: Negative for environmental allergies.  Neurological:  Positive for dizziness and headaches. Negative for weakness.  Psychiatric/Behavioral:  The patient is nervous/anxious.      Objective:    Physical Exam Vitals and nursing note reviewed.  Constitutional:      Appearance: Normal appearance. She is well-developed.  HENT:     Head: Normocephalic and atraumatic.     Right Ear: Ear canal and external ear normal.  Left Ear: Ear canal and external ear normal.     Nose: Nose normal.     Mouth/Throat:     Mouth: Mucous membranes are moist.  Eyes:     Extraocular Movements: Extraocular movements intact.     Conjunctiva/sclera: Conjunctivae normal.     Pupils: Pupils are equal, round, and reactive to light.  Neck:     Thyroid: No thyromegaly or thyroid tenderness.     Vascular: No carotid bruit.  Cardiovascular:     Rate and Rhythm: Normal rate and regular rhythm.     Pulses: Normal pulses.     Heart sounds: Normal heart sounds.  Pulmonary:     Effort: Pulmonary effort is normal.     Breath sounds: Normal breath sounds.  Abdominal:     Palpations: Abdomen is soft.  Musculoskeletal:        General: Normal range of motion.     Cervical back: Normal range of motion and neck supple.  Lymphadenopathy:     Cervical: No cervical adenopathy.  Skin:    General: Skin is warm and dry.     Capillary Refill: Capillary refill takes less than 2 seconds.  Neurological:     General: No focal deficit present.     Mental Status: She is alert and oriented to person, place, and time.  Psychiatric:        Mood and Affect: Mood normal.        Behavior: Behavior normal.        Thought Content: Thought content normal.        Judgment: Judgment normal.   Today's Vitals   03/02/21 0831  BP: 132/82  Pulse: 92  Temp: 98.4 F (36.9 C)  SpO2: 100%   Weight: 235 lb 8 oz (106.8 kg)  Height: 5\' 2"  (1.575 m)   Body mass index is 43.07 kg/m.   Wt Readings from Last 3 Encounters:  03/02/21 235 lb 8 oz (106.8 kg)  11/29/19 253 lb (114.8 kg)  12/25/18 195 lb 4.8 oz (88.6 kg)     Health Maintenance Due  Topic Date Due   COVID-19 Vaccine (1) Never done   Pneumococcal Vaccine 860-32 Years old (1 - PCV) Never done   TETANUS/TDAP  Never done    There are no preventive care reminders to display for this patient.  No results found for: TSH Lab Results  Component Value Date   WBC 9.8 12/01/2019   HGB 9.3 (L) 12/01/2019   HCT 28.4 (L) 12/01/2019   MCV 89.6 12/01/2019   PLT 234 12/01/2019   Lab Results  Component Value Date   NA 136 11/29/2019   K 4.0 11/29/2019   CO2 21 (L) 11/29/2019   GLUCOSE 84 11/29/2019   BUN 8 11/29/2019   CREATININE 0.60 11/29/2019   BILITOT 0.4 11/29/2019   ALKPHOS 164 (H) 11/29/2019   AST 15 11/29/2019   ALT 12 11/29/2019   PROT 7.0 11/29/2019   ALBUMIN 2.6 (L) 11/29/2019   CALCIUM 8.5 (L) 11/29/2019   ANIONGAP 10 11/29/2019   No results found for: CHOL No results found for: HDL No results found for: LDLCALC No results found for: TRIG No results found for: CHOLHDL No results found for: [id number]    Assessment & Plan:  1. Essential hypertension Trial of propranolol ER 60mg  daily. Encouraged a low salt, high protein diet, and increased physical activity. Will check fasting blood work today.  - CBC with Differential/Platelet - Comprehensive metabolic panel - Lipid panel - propranolol  ER (INDERAL LA) 60 MG 24 hr capsule; Take 1 capsule (60 mg total) by mouth daily.  Dispense: 30 capsule; Refill: 1  2. Irregular heart rhythm Trial of propranolol ER 60mg  daily. Will check labs, including thyroid panel for further evaluation.  - CBC with Differential/Platelet - Comprehensive metabolic panel - T4, free - TSH - propranolol ER (INDERAL LA) 60 MG 24 hr capsule; Take 1 capsule (60 mg total) by  mouth daily.  Dispense: 30 capsule; Refill: 1  3. Migraine with aura and without status migrainosus, not intractable Suspect some of the symptoms she is experiencing may be aura prior to migraine headache. Encouraged her to keep a log of symptoms and presence of headaches. Will reassess at follow up visit.   4. Healthcare maintenance Routine, fasting labs drawn during today's visit.  - CBC with Differential/Platelet - Comprehensive metabolic panel - Lipid panel   Problem List Items Addressed This Visit       Cardiovascular and Mediastinum   Essential hypertension - Primary   Relevant Medications   propranolol ER (INDERAL LA) 60 MG 24 hr capsule   Other Relevant Orders   CBC with Differential/Platelet   Comprehensive metabolic panel   Lipid panel   Migraine with aura and without status migrainosus, not intractable   Relevant Medications   propranolol ER (INDERAL LA) 60 MG 24 hr capsule     Other   Healthcare maintenance   Relevant Orders   CBC with Differential/Platelet   Comprehensive metabolic panel   Lipid panel   Irregular heart rhythm   Relevant Medications   propranolol ER (INDERAL LA) 60 MG 24 hr capsule   Other Relevant Orders   CBC with Differential/Platelet   Comprehensive metabolic panel   T4, free   TSH    Meds ordered this encounter  Medications   propranolol ER (INDERAL LA) 60 MG 24 hr capsule    Sig: Take 1 capsule (60 mg total) by mouth daily.    Dispense:  30 capsule    Refill:  1    Order Specific Question:   Supervising Provider    Answer:   D [[id number]]    Follow-up: Return in about 4 weeks (around 03/30/2021) for bp and heart rate .    04/01/2021, NP

## 2021-03-03 DIAGNOSIS — I499 Cardiac arrhythmia, unspecified: Secondary | ICD-10-CM | POA: Insufficient documentation

## 2021-03-03 DIAGNOSIS — G43109 Migraine with aura, not intractable, without status migrainosus: Secondary | ICD-10-CM | POA: Insufficient documentation

## 2021-03-03 DIAGNOSIS — I10 Essential (primary) hypertension: Secondary | ICD-10-CM | POA: Insufficient documentation

## 2021-03-13 ENCOUNTER — Other Ambulatory Visit: Payer: Medicaid Other

## 2021-03-18 ENCOUNTER — Other Ambulatory Visit: Payer: Self-pay | Admitting: Nurse Practitioner

## 2021-03-18 DIAGNOSIS — R7301 Impaired fasting glucose: Secondary | ICD-10-CM

## 2021-03-18 DIAGNOSIS — Z0000 Encounter for general adult medical examination without abnormal findings: Secondary | ICD-10-CM

## 2021-03-18 DIAGNOSIS — I10 Essential (primary) hypertension: Secondary | ICD-10-CM

## 2021-03-20 ENCOUNTER — Other Ambulatory Visit: Payer: Self-pay

## 2021-03-20 ENCOUNTER — Other Ambulatory Visit: Payer: Medicaid Other

## 2021-03-20 DIAGNOSIS — I10 Essential (primary) hypertension: Secondary | ICD-10-CM

## 2021-03-20 DIAGNOSIS — Z0000 Encounter for general adult medical examination without abnormal findings: Secondary | ICD-10-CM

## 2021-03-20 DIAGNOSIS — R7301 Impaired fasting glucose: Secondary | ICD-10-CM

## 2021-03-21 LAB — COMPREHENSIVE METABOLIC PANEL
ALT: 11 IU/L (ref 0–32)
AST: 10 IU/L (ref 0–40)
Albumin/Globulin Ratio: 1.6 (ref 1.2–2.2)
Albumin: 4.4 g/dL (ref 3.8–4.8)
Alkaline Phosphatase: 105 IU/L (ref 44–121)
BUN/Creatinine Ratio: 16 (ref 9–23)
BUN: 10 mg/dL (ref 6–20)
Bilirubin Total: 0.6 mg/dL (ref 0.0–1.2)
CO2: 22 mmol/L (ref 20–29)
Calcium: 9 mg/dL (ref 8.7–10.2)
Chloride: 99 mmol/L (ref 96–106)
Creatinine, Ser: 0.63 mg/dL (ref 0.57–1.00)
Globulin, Total: 2.8 g/dL (ref 1.5–4.5)
Glucose: 109 mg/dL — ABNORMAL HIGH (ref 65–99)
Potassium: 4.3 mmol/L (ref 3.5–5.2)
Sodium: 136 mmol/L (ref 134–144)
Total Protein: 7.2 g/dL (ref 6.0–8.5)
eGFR: 122 mL/min/{1.73_m2} (ref >59)

## 2021-03-21 LAB — CBC
Hematocrit: 37.1 % (ref 34.0–46.6)
Hemoglobin: 11.8 g/dL (ref 11.1–15.9)
MCH: 27.6 pg (ref 26.6–33.0)
MCHC: 31.8 g/dL (ref 31.5–35.7)
MCV: 87 fL (ref 79–97)
Platelets: 372 10*3/uL (ref 150–450)
RBC: 4.28 x10E6/uL (ref 3.77–5.28)
RDW: 14.2 % (ref 11.7–15.4)
WBC: 11.3 10*3/uL — ABNORMAL HIGH (ref 3.4–10.8)

## 2021-03-21 LAB — LIPID PANEL
Chol/HDL Ratio: 4.1 ratio (ref 0.0–4.4)
Cholesterol, Total: 149 mg/dL (ref 100–199)
HDL: 36 mg/dL — ABNORMAL LOW (ref >39)
LDL Chol Calc (NIH): 94 mg/dL (ref 0–99)
Triglycerides: 104 mg/dL (ref 0–149)
VLDL Cholesterol Cal: 19 mg/dL (ref 5–40)

## 2021-03-29 NOTE — Progress Notes (Signed)
Labs look good overall. Discuss at next visit with patient.

## 2021-04-06 ENCOUNTER — Ambulatory Visit: Payer: Medicaid Other | Admitting: Nurse Practitioner

## 2021-05-05 ENCOUNTER — Other Ambulatory Visit: Payer: Self-pay

## 2021-05-05 ENCOUNTER — Encounter: Payer: Self-pay | Admitting: Nurse Practitioner

## 2021-05-05 ENCOUNTER — Ambulatory Visit: Payer: Medicaid Other | Admitting: Nurse Practitioner

## 2021-05-05 VITALS — BP 164/93 | HR 115 | Temp 97.5°F | Ht 62.0 in | Wt 230.0 lb

## 2021-05-05 DIAGNOSIS — I10 Essential (primary) hypertension: Secondary | ICD-10-CM | POA: Diagnosis not present

## 2021-05-05 DIAGNOSIS — I499 Cardiac arrhythmia, unspecified: Secondary | ICD-10-CM | POA: Diagnosis not present

## 2021-05-05 DIAGNOSIS — Z6841 Body Mass Index (BMI) 40.0 and over, adult: Secondary | ICD-10-CM

## 2021-05-05 MED ORDER — PROPRANOLOL HCL ER 80 MG PO CP24: 80.0000 mg | ORAL_CAPSULE | Freq: Every day | ORAL | 1 refills | Status: DC

## 2021-05-05 NOTE — Progress Notes (Signed)
Established Patient Office Visit  Subjective:  Patient ID: Kerry Richards, female    DOB: 1990-10-06  Age: 30 y.o. MRN: [id number]  CC:  Chief Complaint  Patient presents with   Hypertension     HPI Stassi Fadely presents for follow-up of hypertension and mild tachycardia.  Added Inderal 60 mg daily.  Blood pressure and heart rate are still elevated though much improved from prior visit.  Patient said that initially, she had trouble sleeping after taking Inderal.  Switch to taking it during the day.  Doing much better.  Denies dizziness, fatigue, headache, or nausea associated with adding new medication.  She states that since starting on Inderal, frequency and severity of migraine headaches has decreased. She did have routine fasting labs done prior to this visit.  HDL was mildly low at 36 with remainder of lipid panel within normal limits.  Remainder of her labs were essentially normal. She denies new concerns or complaints today.  She denies chest pain, chest pressure, or shortness of breath. She denies headaches or visual disturbances. She denies abdominal pain, nausea, vomiting, or changes in bowel or bladder habits.    Past Medical History:  Diagnosis Date   Anxiety    Hx of pyelonephritis    Hypertension    Migraine    Status post repeat low transverse cesarean section 11/29/2019   Trichomonas contact, treated    Vaginal Pap smear, abnormal     Past Surgical History:  Procedure Laterality Date   CESAREAN SECTION  08/24/2012   Procedure: CESAREAN SECTION;  Surgeon: Logan Bores, MD;  Location: [facility];  Service: Obstetrics;  Laterality: N/A;  Primary Cesarean Section Delivery Baby  Boy @ 0004, Apgars 9/9   CESAREAN SECTION WITH BILATERAL TUBAL LIGATION Bilateral 11/29/2019   Procedure: CESAREAN SECTION WITH BILATERAL TUBAL LIGATION;  Surgeon: Janyth Contes, MD;  Location: [place] [facility];  Service: Obstetrics;  Laterality: Bilateral;  Tracey RNFA   DILATION AND  CURETTAGE OF UTERUS     TOOTH EXTRACTION     WISDOM TOOTH EXTRACTION      Family History  Problem Relation Age of Onset   Hypertension Father    Migraines Father    Stroke Father    Arthritis Maternal Aunt    Hypertension Maternal Aunt    Arthritis Maternal Grandmother    Cancer Maternal Grandmother        lung   Diabetes Paternal Grandfather    Heart disease Paternal Grandfather    Hypertension Paternal Grandfather    Heart attack Paternal Grandfather    Heart disease Paternal Aunt    Heart attack Paternal Aunt    Depression Paternal Uncle    Suicidality Paternal Uncle    Anesthesia problems Neg Hx    Other Neg Hx     Social History   Socioeconomic History   Marital status: Single    Spouse name: Not on file   Number of children: Not on file   Years of education: Not on file   Highest education level: Not on file  Occupational History   Not on file  Tobacco Use   Smoking status: Every Day    Packs/day: 0.50    Years: 14.00    Pack years: 7.00    Types: Cigarettes    Last attempt to quit: 09/30/2011    Years since quitting: 9.6   Smokeless tobacco: Never  Vaping Use   Vaping Use: Never used  Substance and Sexual Activity   Alcohol use: Yes  Alcohol/week: 3.0 standard drinks    Types: 3 Shots of liquor per week   Drug use: Yes    Types: Marijuana   Sexual activity: Yes    Birth control/protection: None  Other Topics Concern   Not on file  Social History Narrative   Not on file   Social Determinants of Health   Financial Resource Strain: Not on file  Food Insecurity: Not on file  Transportation Needs: Not on file  Physical Activity: Not on file  Stress: Not on file  Social Connections: Not on file  Intimate Partner Violence: Not on file    Outpatient Medications Prior to Visit  Medication Sig Dispense Refill   ibuprofen (ADVIL) 800 MG tablet Take 1 tablet (800 mg total) by mouth every 8 (eight) hours. 30 tablet 0   propranolol ER (INDERAL LA)  60 MG 24 hr capsule Take 1 capsule (60 mg total) by mouth daily. 30 capsule 1   acetaminophen (TYLENOL) 500 MG tablet Take 1,000 mg by mouth every 6 (six) hours as needed for mild pain or headache. (Patient not taking: Reported on 03/02/2021)     oxyCODONE (OXY IR/ROXICODONE) 5 MG immediate release tablet Take 1 tablet (5 mg total) by mouth every 4 (four) hours as needed for severe pain. (Patient not taking: Reported on 03/02/2021) 15 tablet 0   pantoprazole (PROTONIX) 40 MG tablet Take 40 mg by mouth daily. (Patient not taking: Reported on 03/02/2021)     polyethylene glycol (MIRALAX / GLYCOLAX) 17 g packet Take 17 g by mouth daily as needed for mild constipation. (Patient not taking: Reported on 03/02/2021)     No facility-administered medications prior to visit.    Allergies  Allergen Reactions   Metronidazole Hives   Shellfish Allergy Anaphylaxis   Amitriptyline Other (See Comments)    Nightmares     ROS Review of Systems  Constitutional:  Negative for activity change, appetite change, chills, fatigue and fever.  HENT:  Negative for congestion, postnasal drip, rhinorrhea, sinus pressure, sinus pain, sneezing and sore throat.   Eyes: Negative.   Respiratory:  Negative for cough, chest tightness, shortness of breath and wheezing.   Cardiovascular:  Negative for chest pain and palpitations.       Improved, though still elevated, blood pressure and heart rate.  Gastrointestinal:  Negative for abdominal pain, constipation, diarrhea, nausea and vomiting.  Endocrine: Negative for cold intolerance, heat intolerance, polydipsia and polyuria.  Genitourinary:  Negative for dyspareunia, dysuria, flank pain, frequency and urgency.  Musculoskeletal:  Negative for arthralgias, back pain and myalgias.  Skin:  Negative for rash.  Allergic/Immunologic: Negative for environmental allergies.  Neurological:  Negative for dizziness, weakness and headaches.  Hematological:  Negative for adenopathy.   Psychiatric/Behavioral:  The patient is not nervous/anxious.      Objective:    Physical Exam Vitals and nursing note reviewed.  Constitutional:      Appearance: Normal appearance. She is well-developed. She is obese.  HENT:     Head: Normocephalic and atraumatic.     Nose: Nose normal.     Mouth/Throat:     Mouth: Mucous membranes are moist.  Eyes:     Extraocular Movements: Extraocular movements intact.     Conjunctiva/sclera: Conjunctivae normal.     Pupils: Pupils are equal, round, and reactive to light.  Cardiovascular:     Rate and Rhythm: Regular rhythm. Tachycardia present.     Pulses: Normal pulses.     Heart sounds: Normal heart sounds.  Pulmonary:  Effort: Pulmonary effort is normal.     Breath sounds: Normal breath sounds.  Abdominal:     Palpations: Abdomen is soft.  Musculoskeletal:        General: Normal range of motion.     Cervical back: Normal range of motion and neck supple.  Lymphadenopathy:     Cervical: No cervical adenopathy.  Skin:    General: Skin is warm and dry.     Capillary Refill: Capillary refill takes less than 2 seconds.  Neurological:     General: No focal deficit present.     Mental Status: She is alert and oriented to person, place, and time.  Psychiatric:        Mood and Affect: Mood normal.        Behavior: Behavior normal.        Thought Content: Thought content normal.        Judgment: Judgment normal.    Today's Vitals   05/05/21 1425  BP: (!) 164/93  Pulse: (!) 115  Temp: (!) 97.5 F (36.4 C)  SpO2: 97%  Weight: 230 lb (104.3 kg)  Height: 5' 2"  (1.575 m)   Body mass index is 42.07 kg/m.   Wt Readings from Last 3 Encounters:  05/05/21 230 lb (104.3 kg)  03/02/21 235 lb 8 oz (106.8 kg)  11/29/19 253 lb (114.8 kg)     Health Maintenance Due  Topic Date Due   COVID-19 Vaccine (1) Never done   Pneumococcal Vaccine 46-31 Years old (1 - PCV) Never done   TETANUS/TDAP  Never done   INFLUENZA VACCINE   04/13/2021    There are no preventive care reminders to display for this patient.  No results found for: TSH Lab Results  Component Value Date   WBC 11.3 (H) 03/20/2021   HGB 11.8 03/20/2021   HCT 37.1 03/20/2021   MCV 87 03/20/2021   PLT 372 03/20/2021   Lab Results  Component Value Date   NA 136 03/20/2021   K 4.3 03/20/2021   CO2 22 03/20/2021   GLUCOSE 109 (H) 03/20/2021   BUN 10 03/20/2021   CREATININE 0.63 03/20/2021   BILITOT 0.6 03/20/2021   ALKPHOS 105 03/20/2021   AST 10 03/20/2021   ALT 11 03/20/2021   PROT 7.2 03/20/2021   ALBUMIN 4.4 03/20/2021   CALCIUM 9.0 03/20/2021   ANIONGAP 10 11/29/2019   EGFR 122 03/20/2021   Lab Results  Component Value Date   CHOL 149 03/20/2021   Lab Results  Component Value Date   HDL 36 (L) 03/20/2021   Lab Results  Component Value Date   LDLCALC 94 03/20/2021   Lab Results  Component Value Date   TRIG 104 03/20/2021   Lab Results  Component Value Date   CHOLHDL 4.1 03/20/2021   No results found for: HGBA1C    Assessment & Plan:  1. Essential hypertension Though improved, blood pressure and heart rate are still elevated.  Increase Inderal LA to 80 mg daily.  Encouraged her to limit salt in her diet and increase water intake.  Will monitor closely.  Recheck at next visit in 1 month. - propranolol ER (INDERAL LA) 80 MG 24 hr capsule; Take 1 capsule (80 mg total) by mouth daily.  Dispense: 30 capsule; Refill: 1  2. Irregular heart rhythm Increase propranolol ER to 80 mg daily. - propranolol ER (INDERAL LA) 80 MG 24 hr capsule; Take 1 capsule (80 mg total) by mouth daily.  Dispense: 30 capsule; Refill: 1  3. Body mass index (BMI) of 40.1-44.9 in adult [facility]) Encourage patient to limit calorie intake to 1500 cal/day.  She should consume a low calorie, low-cholesterol diet.  Gradually incorporate exercise into her daily routine.  Problem List Items Addressed This Visit       Cardiovascular and Mediastinum    Essential hypertension - Primary   Relevant Medications   propranolol ER (INDERAL LA) 80 MG 24 hr capsule     Other   Irregular heart rhythm   Relevant Medications   propranolol ER (INDERAL LA) 80 MG 24 hr capsule   Body mass index (BMI) of 40.1-44.9 in adult [facility])    Meds ordered this encounter  Medications   propranolol ER (INDERAL LA) 80 MG 24 hr capsule    Sig: Take 1 capsule (80 mg total) by mouth daily.    Dispense:  30 capsule    Refill:  1    Please note increased dose    Order Specific Question:   Supervising Provider    Answer:   Beatrice Lecher D [[id number]]   This note was dictated using Dragon Voice Recognition Software. Rapid proofreading was performed to expedite the delivery of the information. Despite proofreading, phonetic errors will occur which are common with this voice recognition software. Please take this into consideration. If there are any concerns, please contact our office.    Follow-up: Return in about 1 month (around 06/05/2021) for HTN, health maintenance exam, with pap.    Ronnell Freshwater, NP

## 2021-05-05 NOTE — Patient Instructions (Addendum)
How to Take Your Blood Pressure Blood pressure is a measurement of how strongly your blood is pressing against the walls of your arteries. Arteries are blood vessels that carry blood from your heart throughout your body. Your health care provider takes your blood pressure at each office visit. You can also take your own blood pressure athome with a blood pressure monitor. You may need to take your own blood pressure to: Confirm a diagnosis of high blood pressure (hypertension). Monitor your blood pressure over time. Make sure your blood pressure medicine is working. Supplies needed: Blood pressure monitor. Dining room chair to sit in. Table or desk. Small notebook and pencil or pen. How to prepare To get the most accurate reading, avoid the following for 30 minutes before you check your blood pressure: Drinking caffeine. Drinking alcohol. Eating. Smoking. Exercising. Five minutes before you check your blood pressure: Use the bathroom and urinate so that you have an empty bladder. Sit quietly in a dining room chair. Do not sit in a soft couch or an armchair. Do not talk. How to take your blood pressure To check your blood pressure, follow the instructions in the manual that came with your blood pressure monitor. If you have a digital blood pressure monitor, the instructions may be as follows: Sit up straight in a chair. Place your feet on the floor. Do not cross your ankles or legs. Rest your left arm at the level of your heart on a table or desk or on the arm of a chair. Pull up your shirt sleeve. Wrap the blood pressure cuff around the upper part of your left arm, 1 inch (2.5 cm) above your elbow. It is best to wrap the cuff around bare skin. Fit the cuff snugly around your arm. You should be able to place only one finger between the cuff and your arm. Position the cord so that it rests in the bend of your elbow. Press the power button. Sit quietly while the cuff inflates and  deflates. Read the digital reading on the monitor screen and write the numbers down (record them) in a notebook. Wait 2-3 minutes, then repeat the steps, starting at step 1. What does my blood pressure reading mean? A blood pressure reading consists of a higher number over a lower number. Ideally, your blood pressure should be below 120/80. The first ("top") number is called the systolic pressure. It is a measure of the pressure in your arteries as your heart beats. The second ("bottom") number is called the diastolic pressure. It is a measure of the pressure in your arteries as theheart relaxes. Blood pressure is classified into five stages. The following are the stages for adults who do not have a short-term serious illness or a chronic condition. Systolic pressure and diastolic pressure are measured in a unit called mm Hg (millimeters of mercury). Normal Systolic pressure: below 161. Diastolic pressure: below 80. Elevated Systolic pressure: 096-045. Diastolic pressure: below 80. Hypertension stage 1 Systolic pressure: 409-811. Diastolic pressure: 91-47. Hypertension stage 2 Systolic pressure: 829 or above. Diastolic pressure: 90 or above. You can have elevated blood pressure or hypertension even if only the systolicor only the diastolic number in your reading is higher than normal. Follow these instructions at home: Medicines Take over-the-counter and prescription medicines only as told by your health care provider. Tell your health care provider if you are having any side effects from blood pressure medicine. General instructions Check your blood pressure as often as recommended by your health  care provider. Check your blood pressure at the same time every day. Take your monitor to the next appointment with your health care provider to make sure that: You are using it correctly. It provides accurate readings. Understand what your goal blood pressure numbers are. Keep all follow-up  visits as told by your health care provider. This is important. General tips Your health care provider can suggest a reliable monitor that will meet your needs. There are several types of home blood pressure monitors. Choose a monitor that has an arm cuff. Do not choose a monitor that measures your blood pressure from your wrist or finger. Choose a cuff that wraps snugly around your upper arm. You should be able to fit only one finger between your arm and the cuff. You can buy a blood pressure monitor at most drugstores or online. Where to find more information American Heart Association: www.heart.org Contact a health care provider if: Your blood pressure is consistently high. Your blood pressure is suddenly low. Get help right away if: Your systolic blood pressure is higher than 180. Your diastolic blood pressure is higher than 120. Summary Blood pressure is a measurement of how strongly your blood is pressing against the walls of your arteries. A blood pressure reading consists of a higher number over a lower number. Ideally, your blood pressure should be below 120/80. Check your blood pressure at the same time every day. Avoid caffeine, alcohol, smoking, and exercise for 30 minutes prior to checking your blood pressure. These agents can affect the accuracy of the blood pressure reading. This information is not intended to replace advice given to you by your health care provider. Make sure you discuss any questions you have with your healthcare provider. Document Revised: 07/09/2020 Document Reviewed: 08/24/2019 Elsevier Patient Education  2022 Else[employer] Inc.  Fat and Cholesterol Restricted Eating Plan Getting too much fat and cholesterol in your diet may cause health problems. Choosing the right foods helps keep your fat and cholesterol at normal levels.This can keep you from getting certain diseases. Your doctor may recommend an eating plan that includes: Total fat: ______% or less of  total calories a day. Saturated fat: ______% or less of total calories a day. Cholesterol: less than _________mg a day. Fiber: ______g a day. What are tips for following this plan? Meal planning At meals, divide your plate into four equal parts: Fill one-half of your plate with vegetables and green salads. Fill one-fourth of your plate with whole grains. Fill one-fourth of your plate with low-fat (lean) protein foods. Eat fish that is high in omega-3 fats at least two times a week. This includes mackerel, tuna, sardines, and salmon. Eat foods that are high in fiber, such as whole grains, beans, apples, broccoli, carrots, peas, and barley. General tips  Work with your doctor to lose weight if you need to. Avoid: Foods with added sugar. Fried foods. Foods with partially hydrogenated oils. Limit alcohol intake to no more than 1 drink a day for nonpregnant women and 2 drinks a day for men. One drink equals 12 oz of beer, 5 oz of wine, or 1 oz of hard liquor.  Reading food labels Check food labels for: Trans fats. Partially hydrogenated oils. Saturated fat (g) in each serving. Cholesterol (mg) in each serving. Fiber (g) in each serving. Choose foods with healthy fats, such as: Monounsaturated fats. Polyunsaturated fats. Omega-3 fats. Choose grain products that have whole grains. Look for the word "whole" as the first word in the ingredient  list. Cooking Cook foods using low-fat methods. These include baking, boiling, grilling, and broiling. Eat more home-cooked foods. Eat at restaurants and buffets less often. Avoid cooking using saturated fats, such as butter, cream, palm oil, palm kernel oil, and coconut oil. Recommended foods  Fruits All fresh, canned (in natural juice), or frozen fruits. Vegetables Fresh or frozen vegetables (raw, steamed, roasted, or grilled). Green salads. Grains Whole grains, such as whole wheat or whole grain breads, crackers, cereals, and pasta.  Unsweetened oatmeal, bulgur, barley, quinoa, or brown rice. Corn or whole wheat flour tortillas. Meats and other protein foods Ground beef (85% or leaner), grass-fed beef, or beef trimmed of fat. Skinless chicken or [place]. Ground chicken or [place]. Pork trimmed of fat. All fish and seafood. Egg whites. Dried beans, peas, or lentils. Unsalted nuts or seeds. Unsalted canned beans. Nut butters without added sugar or oil. Dairy Low-fat or nonfat dairy products, such as skim or 1% milk, 2% or reduced-fat cheeses, low-fat and fat-free ricotta or cottage cheese, or plain low-fat and nonfat yogurt. Fats and oils Tub margarine without trans fats. Light or reduced-fat mayonnaise and salad dressings. Avocado. Olive, canola, sesame, or safflower oils. The items listed above may not be a complete list of foods and beverages youcan eat. Contact a dietitian for more information. Foods to avoid Fruits Canned fruit in heavy syrup. Fruit in cream or butter sauce. Fried fruit. Vegetables Vegetables cooked in cheese, cream, or butter sauce. Fried vegetables. Grains White bread. White pasta. White rice. Cornbread. Bagels, pastries, and croissants. Crackers and snack foods that contain trans fat and hydrogenated oils. Meats and other protein foods Fatty cuts of meat. Ribs, chicken wings, bacon, sausage, bologna, salami, chitterlings, fatback, hot dogs, bratwurst, and packaged lunch meats. Liver and organ meats. Whole eggs and egg yolks. Chicken and [place] with skin. Fried meat. Dairy Whole or 2% milk, cream, half-and-half, and cream cheese. Whole milk cheeses. Whole-fat or sweetened yogurt. Full-fat cheeses. Nondairy creamers and whipped toppings. Processed cheese, cheese spreads, and cheese curds. Beverages Alcohol. Sugar-sweetened drinks such as sodas, lemonade, and fruit drinks. Fats and oils Butter, stick margarine, lard, shortening, ghee, or bacon fat. Coconut, palm kernel, and palm oils. Sweets and  desserts Corn syrup, sugars, honey, and molasses. Candy. Jam and jelly. Syrup. Sweetened cereals. Cookies, pies, cakes, donuts, muffins, and ice cream. The items listed above may not be a complete list of foods and beverages youshould avoid. Contact a dietitian for more information. Summary Choosing the right foods helps keep your fat and cholesterol at normal levels. This can keep you from getting certain diseases. At meals, fill one-half of your plate with vegetables and green salads. Eat high-fiber foods, like whole grains, beans, apples, carrots, peas, and barley. Limit added sugar, saturated fats, alcohol, and fried foods. This information is not intended to replace advice given to you by your health care provider. Make sure you discuss any questions you have with your healthcare provider. Document Revised: 01/02/2020 Document Reviewed: 01/02/2020 Elsevier Patient Education  2022 [employer].

## 2021-05-10 DIAGNOSIS — Z6841 Body Mass Index (BMI) 40.0 and over, adult: Secondary | ICD-10-CM | POA: Insufficient documentation

## 2021-06-08 ENCOUNTER — Other Ambulatory Visit: Payer: Self-pay

## 2021-06-08 ENCOUNTER — Ambulatory Visit (INDEPENDENT_AMBULATORY_CARE_PROVIDER_SITE_OTHER): Payer: Medicaid Other | Admitting: Nurse Practitioner

## 2021-06-08 ENCOUNTER — Other Ambulatory Visit (HOSPITAL_COMMUNITY)
Admission: RE | Admit: 2021-06-08 | Discharge: 2021-06-08 | Disposition: A | Discharge status: Home / Self Care | Payer: Medicaid Other | Source: Ambulatory Visit | Attending: Nurse Practitioner | Admitting: Nurse Practitioner

## 2021-06-08 ENCOUNTER — Encounter: Payer: Self-pay | Admitting: Nurse Practitioner

## 2021-06-08 VITALS — BP 142/91 | HR 77 | Temp 98.6°F | Ht 62.0 in | Wt 228.0 lb

## 2021-06-08 DIAGNOSIS — G43109 Migraine with aura, not intractable, without status migrainosus: Secondary | ICD-10-CM

## 2021-06-08 DIAGNOSIS — Z01419 Encounter for gynecological examination (general) (routine) without abnormal findings: Secondary | ICD-10-CM | POA: Diagnosis present

## 2021-06-08 DIAGNOSIS — Z0001 Encounter for general adult medical examination with abnormal findings: Secondary | ICD-10-CM | POA: Diagnosis not present

## 2021-06-08 DIAGNOSIS — I10 Essential (primary) hypertension: Secondary | ICD-10-CM | POA: Diagnosis not present

## 2021-06-08 DIAGNOSIS — K219 Gastro-esophageal reflux disease without esophagitis: Secondary | ICD-10-CM

## 2021-06-08 DIAGNOSIS — Z6841 Body Mass Index (BMI) 40.0 and over, adult: Secondary | ICD-10-CM

## 2021-06-08 MED ORDER — PANTOPRAZOLE SODIUM 40 MG PO TBEC: 40.0000 mg | DELAYED_RELEASE_TABLET | Freq: Every day | ORAL | 3 refills | Status: DC

## 2021-06-08 NOTE — Progress Notes (Signed)
Established Patient Office Visit  Subjective:  Patient ID: Kerry Richards, female    DOB: 12-25-1990  Age: 30 y.o. MRN: [id number]  CC:  Chief Complaint  Patient presents with   Annual Exam   Gynecologic Exam    HPI Kerry Richards presents for annual wellness visit. Blood pressure remains slightly elevated. She states that headaches and fatigue have improved. She has developed heartburn. Has had this nearly everyday for two weeks. She started taking GERD medication she was taking when she was pregnant. She brought the bottle to show me which is pantoprazole 28m. She is taking this every other day and has had essentially complete relief of heartburn symptoms.  The patient states that she is trying to lose weight. She states that she has not had a soda in a week. She has been walking in her treadmill. She states that her clothing is fitting better but she has not checked her weight. She has lost seven pounds since she was initially seen. She does cook a lot at home. She tries to make lean meats and fresh vegetables. She has healthy lifestyle overall.  She has no other concerns or complaints. She denies chest pain, chest pressure, or shortness of breath. She denies headaches or visual disturbances. She denies abdominal pain, nausea, vomiting, or changes in bowel or bladder habits.    Past Medical History:  Diagnosis Date   Anxiety    Hx of pyelonephritis    Hypertension    Migraine    Status post repeat low transverse cesarean section 11/29/2019   Trichomonas contact, treated    Vaginal Pap smear, abnormal     Past Surgical History:  Procedure Laterality Date   CESAREAN SECTION  08/24/2012   Procedure: CESAREAN SECTION;  Surgeon: KLogan Bores MD;  [facility];  Service: Obstetrics;  Laterality: N/A;  Primary Cesarean Section Delivery Baby  Boy @ 0004, Apgars 9/9   CESAREAN SECTION WITH BILATERAL TUBAL LIGATION Bilateral 11/29/2019   Procedure: CESAREAN SECTION WITH BILATERAL  TUBAL LIGATION;  Surgeon: BJanyth Contes MD;  [facility];  Service: Obstetrics;  Laterality: Bilateral;  Tracey RNFA   DILATION AND CURETTAGE OF UTERUS     TOOTH EXTRACTION     WISDOM TOOTH EXTRACTION      Family History  Problem Relation Age of Onset   Hypertension Father    Migraines Father    Stroke Father    Arthritis Maternal Aunt    Hypertension Maternal Aunt    Arthritis Maternal Grandmother    Cancer Maternal Grandmother        lung   Diabetes Paternal Grandfather    Heart disease Paternal Grandfather    Hypertension Paternal Grandfather    Heart attack Paternal Grandfather    Heart disease Paternal Aunt    Heart attack Paternal Aunt    Depression Paternal Uncle    Suicidality Paternal Uncle    Anesthesia problems Neg Hx    Other Neg Hx     Social History   Socioeconomic History   Marital status: Single    Spouse name: Not on file   Number of children: Not on file   Years of education: Not on file   Highest education level: Not on file  Occupational History   Not on file  Tobacco Use   Smoking status: Every Day    Packs/day: 0.50    Years: 14.00    Pack years: 7.00    Types: Cigarettes    Last attempt  to quit: 09/30/2011    Years since quitting: 9.6   Smokeless tobacco: Never  Vaping Use   Vaping Use: Never used  Substance and Sexual Activity   Alcohol use: Yes    Alcohol/week: 3.0 standard drinks    Types: 3 Shots of liquor per week   Drug use: Yes    Types: Marijuana   Sexual activity: Yes    Birth control/protection: None  Other Topics Concern   Not on file  Social History Narrative   Not on file   Social Determinants of Health   Financial Resource Strain: Not on file  Food Insecurity: Not on file  Transportation Needs: Not on file  Physical Activity: Not on file  Stress: Not on file  Social Connections: Not on file  Intimate Partner Violence: Not on file    Outpatient Medications Prior to Visit  Medication Sig  Dispense Refill   ibuprofen (ADVIL) 800 MG tablet Take 1 tablet (800 mg total) by mouth every 8 (eight) hours. 30 tablet 0   propranolol ER (INDERAL LA) 80 MG 24 hr capsule Take 1 capsule (80 mg total) by mouth daily. 30 capsule 1   No facility-administered medications prior to visit.    Allergies  Allergen Reactions   Metronidazole Hives   Shellfish Allergy Anaphylaxis   Amitriptyline Other (See Comments)    Nightmares     ROS Review of Systems  Constitutional:  Negative for activity change, appetite change, chills, fatigue and fever.       Seven pound weight loss since most recent visit .  HENT:  Negative for congestion, postnasal drip, rhinorrhea, sinus pressure, sinus pain, sneezing and sore throat.   Eyes: Negative.   Respiratory:  Negative for cough, chest tightness, shortness of breath and wheezing.   Cardiovascular:  Negative for chest pain and palpitations.  Gastrointestinal:  Negative for abdominal pain, constipation, diarrhea, nausea and vomiting.       Patient has been getting more frequent episodes of heartburn.  Started taking previously prescribed pantoprazole every other day.  This helped tremendously.  Endocrine: Negative for cold intolerance, heat intolerance, polydipsia and polyuria.  Genitourinary:  Positive for vaginal discharge. Negative for dyspareunia, dysuria, flank pain, frequency, menstrual problem and urgency.  Musculoskeletal:  Negative for arthralgias, back pain and myalgias.  Skin:  Negative for rash.  Allergic/Immunologic: Negative for environmental allergies.  Neurological:  Negative for dizziness, weakness and headaches.  Hematological:  Negative for adenopathy.  Psychiatric/Behavioral:  The patient is not nervous/anxious.      Objective:    Physical Exam Vitals and nursing note reviewed. Exam conducted with a chaperone present.  Constitutional:      Appearance: Normal appearance. She is well-developed. She is obese.  HENT:     Head:  Normocephalic and atraumatic.     Right Ear: Tympanic membrane, ear canal and external ear normal.     Left Ear: Tympanic membrane, ear canal and external ear normal.     Nose: Nose normal.     Mouth/Throat:     Mouth: Mucous membranes are moist.     Pharynx: Oropharynx is clear.  Eyes:     Extraocular Movements: Extraocular movements intact.     Conjunctiva/sclera: Conjunctivae normal.     Pupils: Pupils are equal, round, and reactive to light.  Cardiovascular:     Rate and Rhythm: Normal rate and regular rhythm.     Pulses: Normal pulses.     Heart sounds: Normal heart sounds.  Pulmonary:  Effort: Pulmonary effort is normal.     Breath sounds: Normal breath sounds.  Chest:  Breasts:    Right: Normal. No swelling, bleeding, inverted nipple, mass, nipple discharge, skin change or tenderness.     Left: Normal. No swelling, bleeding, inverted nipple, mass, nipple discharge, skin change or tenderness.    Abdominal:     General: Bowel sounds are normal. There is no distension.     Palpations: Abdomen is soft. There is no mass.     Tenderness: There is no abdominal tenderness. There is no guarding.     Hernia: No hernia is present.  Genitourinary:    General: Normal vulva.     Exam position: Supine.     Labia:        Right: No rash, tenderness or lesion.        Left: No rash, tenderness or lesion.      Vagina: No signs of injury. No vaginal discharge, erythema, tenderness or bleeding.     Cervix: Erythema present. No cervical motion tenderness, discharge, friability or lesion.     Uterus: Normal.      Adnexa: Right adnexa normal.     Comments: There is moderate tenderness and right suprapubic region with bimanual exam.  No masses and no organomegaly present. Musculoskeletal:        General: Normal range of motion.     Cervical back: Normal range of motion and neck supple.  Lymphadenopathy:     Upper Body:     Right upper body: No axillary adenopathy.     Left upper body:  No axillary adenopathy.  Skin:    General: Skin is warm and dry.     Capillary Refill: Capillary refill takes less than 2 seconds.  Neurological:     General: No focal deficit present.     Mental Status: She is alert and oriented to person, place, and time.  Psychiatric:        Mood and Affect: Mood normal.        Behavior: Behavior normal.        Thought Content: Thought content normal.        Judgment: Judgment normal.    Today's Vitals   06/08/21 1047  BP: (!) 142/91  Pulse: 77  Temp: 98.6 F (37 C)  SpO2: 100%  Weight: 228 lb (103.4 kg)  Height: _0  (1.575 m)   Body mass index is 41.7 kg/m.   Wt Readings from Last 3 Encounters:  06/08/21 228 lb (103.4 kg)  05/05/21 230 lb (104.3 kg)  03/02/21 235 lb 8 oz (106.8 kg)     Health Maintenance Due  Topic Date Due   COVID-19 Vaccine (1) Never done   TETANUS/TDAP  Never done   INFLUENZA VACCINE  04/13/2021    There are no preventive care reminders to display for this patient.  No results found for: TSH Lab Results  Component Value Date   WBC 11.3 (H) 03/20/2021   HGB 11.8 03/20/2021   HCT 37.1 03/20/2021   MCV 87 03/20/2021   PLT 372 03/20/2021   Lab Results  Component Value Date   NA 136 03/20/2021   K 4.3 03/20/2021   CO2 22 03/20/2021   GLUCOSE 109 (H) 03/20/2021   BUN 10 03/20/2021   CREATININE 0.63 03/20/2021   BILITOT 0.6 03/20/2021   ALKPHOS 105 03/20/2021   AST 10 03/20/2021   ALT 11 03/20/2021   PROT 7.2 03/20/2021   ALBUMIN 4.4 03/20/2021   CALCIUM  9.0 03/20/2021   ANIONGAP 10 11/29/2019   EGFR 122 03/20/2021   Lab Results  Component Value Date   CHOL 149 03/20/2021   Lab Results  Component Value Date   HDL 36 (L) 03/20/2021   Lab Results  Component Value Date   LDLCALC 94 03/20/2021   Lab Results  Component Value Date   TRIG 104 03/20/2021   Lab Results  Component Value Date   CHOLHDL 4.1 03/20/2021   No results found for: HGBA1C    Assessment & Plan:  1. Encounter  for general adult medical examination with abnormal findings Annual wellness visit today with Pap smear.  2. Essential hypertension Blood pressure mildly elevated, but improved from prior visits.  We will continue propranolol ER 80 mg daily.  Advised patient to limit intake of salt and increase water in the diet.  Discussed positive effects of weight loss on blood pressure.  3. Migraine with aura and without status migrainosus, not intractable Improved with higher dose of propranolol.  Take as prescribed.  4. Gastroesophageal reflux disease without esophagitis New prescription for pantoprazole 40 mg tablets.  Patient to take daily however currently taking every other day.  New prescription sent to her pharmacy. - pantoprazole (PROTONIX) 40 MG tablet; Take 1 tablet (40 mg total) by mouth daily.  Dispense: 30 tablet; Refill: 3  5. Cervical smear, as part of routine gynecological examination Pap smear obtained during today's visit. - Cytology - PAP( [place])  6. Body mass index (BMI) of 40.1-44.9 in adult [facility]) Patient actively trying to lose weight.  She has lost 7 pounds since her initial visit with me.  She is exercising more frequently.  She is cooking at home and making healthy meals.  Will monitor closely  Problem List Items Addressed This Visit       Cardiovascular and Mediastinum   Essential hypertension   Migraine with aura and without status migrainosus, not intractable     Digestive   Gastroesophageal reflux disease without esophagitis   Relevant Medications   pantoprazole (PROTONIX) 40 MG tablet     Other   Encounter for general adult medical examination with abnormal findings - Primary   Body mass index (BMI) of 40.1-44.9 in adult [facility])   Cervical smear, as part of routine gynecological examination   Relevant Orders   Cytology - PAP( [place])    Meds ordered this encounter  Medications   pantoprazole (PROTONIX) 40 MG tablet    Sig: Take 1 tablet (40 mg  total) by mouth daily.    Dispense:  30 tablet    Refill:  3    Order Specific Question:   Supervising Provider    Answer:   Beatrice Lecher D [[id number]]   This note was dictated using Dragon Voice Recognition Software. Rapid proofreading was performed to expedite the delivery of the information. Despite proofreading, phonetic errors will occur which are common with this voice recognition software. Please take this into consideration. If there are any concerns, please contact our office.    Follow-up: Return in about 3 months (around 09/07/2021) for blood pressure.    Ronnell Freshwater, NP

## 2021-06-08 NOTE — Patient Instructions (Signed)
Calorie Counting for Weight Loss Calories are units of energy. Your body needs a certain number of calories from food to keep going throughout the day. When you eat or drink more calories than your body needs, your body stores the extra calories mostly as fat. When you eat or drink fewer calories than your body needs, your body burns fat to getthe energy it needs. Calorie counting means keeping track of how many calories you eat and drink each day. Calorie counting can be helpful if you need to lose weight. If you eat fewer calories than your body needs, you should lose weight. Ask yourhealth care provider what a healthy weight is for you. For calorie counting to work, you will need to eat the right number of calories each day to lose a healthy amount of weight per week. A dietitian can help you figure out how many calories you need in a day and will suggest ways to reach your calorie goal. A healthy amount of weight to lose each week is usually 1-2 lb (0.5-0.9 kg). This usually means that your daily calorie intake should be reduced by 500-750 calories. Eating 1,200-1,500 calories a day can help most women lose weight. Eating 1,500-1,800 calories a day can help most men lose weight. What do I need to know about calorie counting? Work with your health care provider or dietitian to determine how many calories you should get each day. To meet your daily calorie goal, you will need to: Find out how many calories are in each food that you would like to eat. Try to do this before you eat. Decide how much of the food you plan to eat. Keep a food log. Do this by writing down what you ate and how many calories it had. To successfully lose weight, it is important to balance calorie counting with ahealthy lifestyle that includes regular activity. Where do I find calorie information?  The number of calories in a food can be found on a Nutrition Facts label. If a food does not have a Nutrition Facts label, try to  look up the calories onlineor ask your dietitian for help. Remember that calories are listed per serving. If you choose to have more than one serving of a food, you will have to multiply the calories per serving by the number of servings you plan to eat. For example, the label on a package of bread might say that a serving size is 1 slice and that there are 90 calories in a serving. If you eat 1 slice, you will have eaten 90 calories. If you eat 2slices, you will have eaten 180 calories. How do I keep a food log? After each time that you eat, record the following in your food log as soon as possible: What you ate. Be sure to include toppings, sauces, and other extras on the food. How much you ate. This can be measured in cups, ounces, or number of items. How many calories were in each food and drink. The total number of calories in the food you ate. Keep your food log near you, such as in a pocket-sized notebook or on an app or website on your mobile phone. Some programs will calculate calories for you andshow you how many calories you have left to meet your daily goal. What are some portion-control tips? Know how many calories are in a serving. This will help you know how many servings you can have of a certain food. Use a measuring cup to   measure serving sizes. You could also try weighing out portions on a kitchen scale. With time, you will be able to estimate serving sizes for some foods. Take time to put servings of different foods on your favorite plates or in your favorite bowls and cups so you know what a serving looks like. Try not to eat straight from a food's packaging, such as from a bag or box. Eating straight from the package makes it hard to see how much you are eating and can lead to overeating. Put the amount you would like to eat in a cup or on a plate to make sure you are eating the right portion. Use smaller plates, glasses, and bowls for smaller portions and to prevent  overeating. Try not to multitask. For example, avoid watching TV or using your computer while eating. If it is time to eat, sit down at a table and enjoy your food. This will help you recognize when you are full. It will also help you be more mindful of what and how much you are eating. What are tips for following this plan? Reading food labels Check the calorie count compared with the serving size. The serving size may be smaller than what you are used to eating. Check the source of the calories. Try to choose foods that are high in protein, fiber, and vitamins, and low in saturated fat, trans fat, and sodium. Shopping Read nutrition labels while you shop. This will help you make healthy decisions about which foods to buy. Pay attention to nutrition labels for low-fat or fat-free foods. These foods sometimes have the same number of calories or more calories than the full-fat versions. They also often have added sugar, starch, or salt to make up for flavor that was removed with the fat. Make a grocery list of lower-calorie foods and stick to it. Cooking Try to cook your favorite foods in a healthier way. For example, try baking instead of frying. Use low-fat dairy products. Meal planning Use more fruits and vegetables. One-half of your plate should be fruits and vegetables. Include lean proteins, such as chicken, turkey, and fish. Lifestyle Each week, aim to do one of the following: 150 minutes of moderate exercise, such as walking. 75 minutes of vigorous exercise, such as running. General information Know how many calories are in the foods you eat most often. This will help you calculate calorie counts faster. Find a way of tracking calories that works for you. Get creative. Try different apps or programs if writing down calories does not work for you. What foods should I eat?  Eat nutritious foods. It is better to have a nutritious, high-calorie food, such as an avocado, than a food with  few nutrients, such as a bag of potato chips. Use your calories on foods and drinks that will fill you up and will not leave you hungry soon after eating. Examples of foods that fill you up are nuts and nut butters, vegetables, lean proteins, and high-fiber foods such as whole grains. High-fiber foods are foods with more than 5 g of fiber per serving. Pay attention to calories in drinks. Low-calorie drinks include water and unsweetened drinks. The items listed above may not be a complete list of foods and beverages you can eat. Contact a dietitian for more information. What foods should I limit? Limit foods or drinks that are not good sources of vitamins, minerals, or protein or that are high in unhealthy fats. These include: Candy. Other sweets. Sodas, specialty   coffee drinks, alcohol, and juice. The items listed above may not be a complete list of foods and beverages you should avoid. Contact a dietitian for more information. How do I count calories when eating out? Pay attention to portions. Often, portions are much larger when eating out. Try these tips to keep portions smaller: Consider sharing a meal instead of getting your own. If you get your own meal, eat only half of it. Before you start eating, ask for a container and put half of your meal into it. When available, consider ordering smaller portions from the menu instead of full portions. Pay attention to your food and drink choices. Knowing the way food is cooked and what is included with the meal can help you eat fewer calories. If calories are listed on the menu, choose the lower-calorie options. Choose dishes that include vegetables, fruits, whole grains, low-fat dairy products, and lean proteins. Choose items that are boiled, broiled, grilled, or steamed. Avoid items that are buttered, battered, fried, or served with cream sauce. Items labeled as crispy are usually fried, unless stated otherwise. Choose water, low-fat milk,  unsweetened iced tea, or other drinks without added sugar. If you want an alcoholic beverage, choose a lower-calorie option, such as a glass of wine or light beer. Ask for dressings, sauces, and syrups on the side. These are usually high in calories, so you should limit the amount you eat. If you want a salad, choose a garden salad and ask for grilled meats. Avoid extra toppings such as bacon, cheese, or fried items. Ask for the dressing on the side, or ask for olive oil and vinegar or lemon to use as dressing. Estimate how many servings of a food you are given. Knowing serving sizes will help you be aware of how much food you are eating at restaurants. Where to find more information Centers for Disease Control and Prevention: www.cdc.gov [place] Department of Agriculture: myplate.gov Summary Calorie counting means keeping track of how many calories you eat and drink each day. If you eat fewer calories than your body needs, you should lose weight. A healthy amount of weight to lose per week is usually 1-2 lb (0.5-0.9 kg). This usually means reducing your daily calorie intake by 500-750 calories. The number of calories in a food can be found on a Nutrition Facts label. If a food does not have a Nutrition Facts label, try to look up the calories online or ask your dietitian for help. Use smaller plates, glasses, and bowls for smaller portions and to prevent overeating. Use your calories on foods and drinks that will fill you up and not leave you hungry shortly after a meal. This information is not intended to replace advice given to you by your health care provider. Make sure you discuss any questions you have with your healthcare provider. Document Revised: 10/11/2019 Document Reviewed: 10/11/2019 Elsevier Patient Education  2022 [employer]  Fat and Cholesterol Restricted Eating Plan Getting too much fat and cholesterol in your diet may cause health problems. Choosing the right foods helps keep  your fat and cholesterol at normal levels.This can keep you from getting certain diseases. Your doctor may recommend an eating plan that includes: Total fat: ______% or less of total calories a day. Saturated fat: ______% or less of total calories a day. Cholesterol: less than _________mg a day. Fiber: ______g a day. What are tips for following this plan? Meal planning At meals, divide your plate into four equal parts: Fill one-half of   your plate with vegetables and green salads. Fill one-fourth of your plate with whole grains. Fill one-fourth of your plate with low-fat (lean) protein foods. Eat fish that is high in omega-3 fats at least two times a week. This includes mackerel, tuna, sardines, and salmon. Eat foods that are high in fiber, such as whole grains, beans, apples, broccoli, carrots, peas, and barley. General tips  Work with your doctor to lose weight if you need to. Avoid: Foods with added sugar. Fried foods. Foods with partially hydrogenated oils. Limit alcohol intake to no more than 1 drink a day for nonpregnant women and 2 drinks a day for men. One drink equals 12 oz of beer, 5 oz of wine, or 1 oz of hard liquor.  Reading food labels Check food labels for: Trans fats. Partially hydrogenated oils. Saturated fat (g) in each serving. Cholesterol (mg) in each serving. Fiber (g) in each serving. Choose foods with healthy fats, such as: Monounsaturated fats. Polyunsaturated fats. Omega-3 fats. Choose grain products that have whole grains. Look for the word "whole" as the first word in the ingredient list. Cooking Cook foods using low-fat methods. These include baking, boiling, grilling, and broiling. Eat more home-cooked foods. Eat at restaurants and buffets less often. Avoid cooking using saturated fats, such as butter, cream, palm oil, palm kernel oil, and coconut oil. Recommended foods  Fruits All fresh, canned (in natural juice), or frozen  fruits. Vegetables Fresh or frozen vegetables (raw, steamed, roasted, or grilled). Green salads. Grains Whole grains, such as whole wheat or whole grain breads, crackers, cereals, and pasta. Unsweetened oatmeal, bulgur, barley, quinoa, or brown rice. Corn or whole wheat flour tortillas. Meats and other protein foods Ground beef (85% or leaner), grass-fed beef, or beef trimmed of fat. Skinless chicken or turkey. Ground chicken or turkey. Pork trimmed of fat. All fish and seafood. Egg whites. Dried beans, peas, or lentils. Unsalted nuts or seeds. Unsalted canned beans. Nut butters without added sugar or oil. Dairy Low-fat or nonfat dairy products, such as skim or 1% milk, 2% or reduced-fat cheeses, low-fat and fat-free ricotta or cottage cheese, or plain low-fat and nonfat yogurt. Fats and oils Tub margarine without trans fats. Light or reduced-fat mayonnaise and salad dressings. Avocado. Olive, canola, sesame, or safflower oils. The items listed above may not be a complete list of foods and beverages youcan eat. Contact a dietitian for more information. Foods to avoid Fruits Canned fruit in heavy syrup. Fruit in cream or butter sauce. Fried fruit. Vegetables Vegetables cooked in cheese, cream, or butter sauce. Fried vegetables. Grains White bread. White pasta. White rice. Cornbread. Bagels, pastries, and croissants. Crackers and snack foods that contain trans fat and hydrogenated oils. Meats and other protein foods Fatty cuts of meat. Ribs, chicken wings, bacon, sausage, bologna, salami, chitterlings, fatback, hot dogs, bratwurst, and packaged lunch meats. Liver and organ meats. Whole eggs and egg yolks. Chicken and turkey with skin. Fried meat. Dairy Whole or 2% milk, cream, half-and-half, and cream cheese. Whole milk cheeses. Whole-fat or sweetened yogurt. Full-fat cheeses. Nondairy creamers and whipped toppings. Processed cheese, cheese spreads, and cheese curds. Beverages Alcohol.  Sugar-sweetened drinks such as sodas, lemonade, and fruit drinks. Fats and oils Butter, stick margarine, lard, shortening, ghee, or bacon fat. Coconut, palm kernel, and palm oils. Sweets and desserts Corn syrup, sugars, honey, and molasses. Candy. Jam and jelly. Syrup. Sweetened cereals. Cookies, pies, cakes, donuts, muffins, and ice cream. The items listed above may not be a complete list   of foods and beverages youshould avoid. Contact a dietitian for more information. Summary Choosing the right foods helps keep your fat and cholesterol at normal levels. This can keep you from getting certain diseases. At meals, fill one-half of your plate with vegetables and green salads. Eat high-fiber foods, like whole grains, beans, apples, carrots, peas, and barley. Limit added sugar, saturated fats, alcohol, and fried foods. This information is not intended to replace advice given to you by your health care provider. Make sure you discuss any questions you have with your healthcare provider. Document Revised: 01/02/2020 Document Reviewed: 01/02/2020 Else[employer] Patient Education  2022 [employer]  

## 2021-06-10 LAB — CYTOLOGY - PAP
Adequacy: ABSENT
Comment: NEGATIVE
Diagnosis: NEGATIVE
High risk HPV: NEGATIVE

## 2021-06-11 NOTE — Progress Notes (Signed)
Please let the patient know that her pap smear was normal. We weill repeat this in three years.  °Thanks so much.   -HB

## 2021-06-12 ENCOUNTER — Encounter: Payer: Self-pay | Admitting: Nurse Practitioner

## 2021-07-07 ENCOUNTER — Other Ambulatory Visit: Payer: Self-pay

## 2021-07-07 ENCOUNTER — Other Ambulatory Visit: Payer: Self-pay | Admitting: Nurse Practitioner

## 2021-07-07 DIAGNOSIS — I499 Cardiac arrhythmia, unspecified: Secondary | ICD-10-CM

## 2021-07-07 DIAGNOSIS — I10 Essential (primary) hypertension: Secondary | ICD-10-CM

## 2021-07-07 MED ORDER — PROPRANOLOL HCL ER 80 MG PO CP24: 80.0000 mg | ORAL_CAPSULE | Freq: Every day | ORAL | 0 refills | Status: DC

## 2021-08-10 ENCOUNTER — Other Ambulatory Visit: Payer: Self-pay

## 2021-08-10 ENCOUNTER — Ambulatory Visit (INDEPENDENT_AMBULATORY_CARE_PROVIDER_SITE_OTHER): Payer: Medicaid Other | Admitting: Nurse Practitioner

## 2021-08-10 ENCOUNTER — Encounter: Payer: Self-pay | Admitting: Nurse Practitioner

## 2021-08-10 VITALS — BP 133/80 | HR 90 | Temp 98.7°F | Ht 62.0 in | Wt 224.0 lb

## 2021-08-10 DIAGNOSIS — N765 Ulceration of vagina: Secondary | ICD-10-CM | POA: Diagnosis not present

## 2021-08-10 DIAGNOSIS — B9689 Other specified bacterial agents as the cause of diseases classified elsewhere: Secondary | ICD-10-CM | POA: Insufficient documentation

## 2021-08-10 DIAGNOSIS — N760 Acute vaginitis: Secondary | ICD-10-CM

## 2021-08-10 DIAGNOSIS — N898 Other specified noninflammatory disorders of vagina: Secondary | ICD-10-CM | POA: Insufficient documentation

## 2021-08-10 DIAGNOSIS — Z6841 Body Mass Index (BMI) 40.0 and over, adult: Secondary | ICD-10-CM | POA: Diagnosis not present

## 2021-08-10 MED ORDER — DOXYCYCLINE HYCLATE 100 MG PO TABS: 100.0000 mg | ORAL_TABLET | Freq: Two times a day (BID) | ORAL | 0 refills | Status: DC

## 2021-08-10 NOTE — Progress Notes (Signed)
Acute Office Visit  Subjective:    Patient ID: Kerry Richards, female    DOB: 03-17-1991, 30 y.o.   MRN: [id number]  Chief Complaint  Patient presents with   Vaginal Pain    The patient did have pap smear on 06/10/2021 with normal results and negative HPV .   Vaginal Pain The patient's primary symptoms include genital lesions, a genital odor, pelvic pain and vaginal discharge. This is a new problem. The current episode started in the past 7 days. The problem occurs constantly. The problem has been unchanged. The patient is experiencing no pain. The problem affects the left side. She is not pregnant. Associated symptoms include abdominal pain and rash. Pertinent negatives include no back pain, chills, constipation, diarrhea, discolored urine, dysuria, fever, flank pain, frequency, headaches, hematuria, nausea, sore throat, urgency or vomiting. The vaginal discharge was malodorous and mucoid. There has been no bleeding. She has not been passing clots. She has not been passing tissue. Nothing aggravates the symptoms. Treatments tried: patient did use a home remedy for bacterial vaginitis which did not help. The treatment provided no relief. She is sexually active. No, her partner does not have an STD. She uses nothing for contraception. Her menstrual history has been regular. Her past medical history is significant for a Cesarean section.    Past Medical History:  Diagnosis Date   Anxiety    Hx of pyelonephritis    Hypertension    Migraine    Status post repeat low transverse cesarean section 11/29/2019   Trichomonas contact, treated    Vaginal Pap smear, abnormal     Past Surgical History:  Procedure Laterality Date   CESAREAN SECTION  08/24/2012   Procedure: CESAREAN SECTION;  Surgeon: Logan Bores, MD;  Location: [facility];  Service: Obstetrics;  Laterality: N/A;  Primary Cesarean Section Delivery Baby  Boy @ 0004, Apgars 9/9   CESAREAN SECTION WITH BILATERAL TUBAL LIGATION  Bilateral 11/29/2019   Procedure: CESAREAN SECTION WITH BILATERAL TUBAL LIGATION;  Surgeon: Janyth Contes, MD;  Location: [facility];  Service: Obstetrics;  Laterality: Bilateral;  Tracey RNFA   DILATION AND CURETTAGE OF UTERUS     TOOTH EXTRACTION     WISDOM TOOTH EXTRACTION      Family History  Problem Relation Age of Onset   Hypertension Father    Migraines Father    Stroke Father    Arthritis Maternal Aunt    Hypertension Maternal Aunt    Arthritis Maternal Grandmother    Cancer Maternal Grandmother        lung   Diabetes Paternal Grandfather    Heart disease Paternal Grandfather    Hypertension Paternal Grandfather    Heart attack Paternal Grandfather    Heart disease Paternal Aunt    Heart attack Paternal Aunt    Depression Paternal Uncle    Suicidality Paternal Uncle    Anesthesia problems Neg Hx    Other Neg Hx     Social History   Socioeconomic History   Marital status: Single    Spouse name: Not on file   Number of children: Not on file   Years of education: Not on file   Highest education level: Not on file  Occupational History   Not on file  Tobacco Use   Smoking status: Every Day    Packs/day: 0.50    Years: 14.00    Pack years: 7.00    Types: Cigarettes    Last attempt to quit: 09/30/2011  Years since quitting: 9.8   Smokeless tobacco: Never  Vaping Use   Vaping Use: Never used  Substance and Sexual Activity   Alcohol use: Yes    Alcohol/week: 3.0 standard drinks    Types: 3 Shots of liquor per week   Drug use: Yes    Types: Marijuana   Sexual activity: Yes    Birth control/protection: None  Other Topics Concern   Not on file  Social History Narrative   Not on file   Social Determinants of Health   Financial Resource Strain: Not on file  Food Insecurity: Not on file  Transportation Needs: Not on file  Physical Activity: Not on file  Stress: Not on file  Social Connections: Not on file  Intimate Partner Violence: Not on file     Outpatient Medications Prior to Visit  Medication Sig Dispense Refill   ibuprofen (ADVIL) 800 MG tablet Take 1 tablet (800 mg total) by mouth every 8 (eight) hours. 30 tablet 0   pantoprazole (PROTONIX) 40 MG tablet Take 1 tablet (40 mg total) by mouth daily. 30 tablet 3   propranolol ER (INDERAL LA) 80 MG 24 hr capsule TAKE 1 CAPSULE (80 MG TOTAL) BY MOUTH DAILY. 90 capsule 0   propranolol ER (INDERAL LA) 80 MG 24 hr capsule Take 1 capsule (80 mg total) by mouth daily. 90 capsule 0   No facility-administered medications prior to visit.    Allergies  Allergen Reactions   Metronidazole Hives   Shellfish Allergy Anaphylaxis   Amitriptyline Other (See Comments)    Nightmares     Review of Systems  Constitutional:  Negative for activity change, appetite change, chills, fatigue and fever.  HENT:  Negative for congestion, postnasal drip, rhinorrhea, sinus pressure, sinus pain, sneezing and sore throat.   Eyes: Negative.   Respiratory:  Negative for cough, chest tightness, shortness of breath and wheezing.   Cardiovascular:  Negative for chest pain and palpitations.  Gastrointestinal:  Positive for abdominal pain. Negative for constipation, diarrhea, nausea and vomiting.  Endocrine: Negative for cold intolerance, heat intolerance, polydipsia and polyuria.  Genitourinary:  Positive for dyspareunia, pelvic pain, vaginal discharge and vaginal pain. Negative for dysuria, flank pain, frequency, hematuria and urgency.  Musculoskeletal:  Negative for arthralgias, back pain and myalgias.  Skin:  Positive for rash.  Allergic/Immunologic: Negative for environmental allergies.  Neurological:  Negative for dizziness, weakness and headaches.  Hematological:  Negative for adenopathy.  Psychiatric/Behavioral:  The patient is not nervous/anxious.       Objective:    Physical Exam Vitals and nursing note reviewed. Exam conducted with a chaperone present.  Constitutional:      Appearance:  Normal appearance. She is well-developed.  HENT:     Head: Normocephalic and atraumatic.  Eyes:     Pupils: Pupils are equal, round, and reactive to light.  Cardiovascular:     Rate and Rhythm: Normal rate and regular rhythm.     Pulses: Normal pulses.     Heart sounds: Normal heart sounds.  Pulmonary:     Effort: Pulmonary effort is normal.     Breath sounds: Normal breath sounds.  Abdominal:     Palpations: Abdomen is soft.  Genitourinary:    Exam position: Supine.     Labia:        Right: No rash or lesion.        Left: Rash and lesion present. No tenderness.        Comments: Few, small, round, white lesions  on left labia. These are non tender. No drainage. Mucus membrane currently intact.  Musculoskeletal:        General: Normal range of motion.     Cervical back: Normal range of motion and neck supple.  Lymphadenopathy:     Cervical: No cervical adenopathy.  Skin:    General: Skin is warm and dry.     Capillary Refill: Capillary refill takes less than 2 seconds.  Neurological:     General: No focal deficit present.     Mental Status: She is alert and oriented to person, place, and time.  Psychiatric:        Mood and Affect: Mood normal.        Behavior: Behavior normal.        Thought Content: Thought content normal.        Judgment: Judgment normal.    Today's Vitals   08/10/21 1536  BP: 133/80  Pulse: 90  Temp: 98.7 F (37.1 C)  SpO2: 100%  Weight: 224 lb (101.6 kg)  Height: 5' 2"  (1.575 m)   Body mass index is 40.97 kg/m.   Wt Readings from Last 3 Encounters:  08/10/21 224 lb (101.6 kg)  06/08/21 228 lb (103.4 kg)  05/05/21 230 lb (104.3 kg)    Health Maintenance Due  Topic Date Due   COVID-19 Vaccine (1) Never done   TETANUS/TDAP  Never done   INFLUENZA VACCINE  04/13/2021    There are no preventive care reminders to display for this patient.   No results found for: TSH Lab Results  Component Value Date   WBC 11.3 (H) 03/20/2021   HGB  11.8 03/20/2021   HCT 37.1 03/20/2021   MCV 87 03/20/2021   PLT 372 03/20/2021   Lab Results  Component Value Date   NA 136 03/20/2021   K 4.3 03/20/2021   CO2 22 03/20/2021   GLUCOSE 109 (H) 03/20/2021   BUN 10 03/20/2021   CREATININE 0.63 03/20/2021   BILITOT 0.6 03/20/2021   ALKPHOS 105 03/20/2021   AST 10 03/20/2021   ALT 11 03/20/2021   PROT 7.2 03/20/2021   ALBUMIN 4.4 03/20/2021   CALCIUM 9.0 03/20/2021   ANIONGAP 10 11/29/2019   EGFR 122 03/20/2021   Lab Results  Component Value Date   CHOL 149 03/20/2021   Lab Results  Component Value Date   HDL 36 (L) 03/20/2021   Lab Results  Component Value Date   LDLCALC 94 03/20/2021   Lab Results  Component Value Date   TRIG 104 03/20/2021   Lab Results  Component Value Date   CHOLHDL 4.1 03/20/2021   No results found for: HGBA1C     Assessment & Plan:  1. Bacterial vaginitis Will treat prophylactically with doxycycline 100 mg tablets twice daily for next 7 days.  Encouraged her to remain abstinent throughout course of antibiotics.  Consider using pre and probiotic to help keep vaginal in get flora balance intact. - doxycycline (VIBRA-TABS) 100 MG tablet; Take 1 tablet (100 mg total) by mouth 2 (two) times daily.  Dispense: 14 tablet; Refill: 0  2. Vaginal ulcer Sample obtained for STI screening.  Blood work obtained for HSV 2. - STI Profile, CT/NG/TV - HSV 2 antibody, IgG  3. Vaginal discharge Patient treated prophylactically with doxycycline twice daily for 7 days.  Samples obtained for STI screening.  Blood work obtained for HSV testing.  Will notify patient of results as soon as they are available. - GC/Chlamydia Probe Amp -  STI Profile, CT/NG/TV - HSV 2 antibody, IgG  4. Body mass index (BMI) of 40.1-44.9 in adult [facility]) Discussed lowering calorie intake to 1500 calories per day and incorporating exercise into daily routine to help lose weight. Will monitor.     Problem List Items Addressed This  Visit       Genitourinary   Bacterial vaginitis - Primary   Relevant Medications   doxycycline (VIBRA-TABS) 100 MG tablet   Vaginal ulcer   Relevant Orders   STI Profile, CT/NG/TV   HSV 2 antibody, IgG     Other   Body mass index (BMI) of 40.1-44.9 in adult [facility])   Vaginal discharge   Relevant Orders   GC/Chlamydia Probe Amp   STI Profile, CT/NG/TV   HSV 2 antibody, IgG     Meds ordered this encounter  Medications   doxycycline (VIBRA-TABS) 100 MG tablet    Sig: Take 1 tablet (100 mg total) by mouth 2 (two) times daily.    Dispense:  14 tablet    Refill:  0    Order Specific Question:   Supervising Provider    Answer:   Beatrice Lecher D [[id number]]     Ronnell Freshwater, NP  This note was dictated using Dragon Voice Recognition Software. Rapid proofreading was performed to expedite the delivery of the information. Despite proofreading, phonetic errors will occur which are common with this voice recognition software. Please take this into consideration. If there are any concerns, please contact our office.

## 2021-08-10 NOTE — Patient Instructions (Addendum)
Chlamydia Test Why am I having this test? Chlamydia is a common STI (sexually transmitted infection), especially in people younger than 32 years of age. Chlamydia may be associated with other STIs, such as gonorrhea. Your health care provider may test you for chlamydia if you: Are sexually active. Have another STI. Have possible symptoms of chlamydia infection, such as pelvic pain or vaginal discharge. What is being tested? This test checks for the presence of the bacteria Chlamydia trachomatis, which causes chlamydia infection. What kind of sample is taken?   Depending on your symptoms, your health care provider may collect one of the following samples: A urine sample. This is collected in a germ-free (sterile) container that is given to you by your health care provider or the lab where the urine will be examined and tested. A fluid sample. This may be collected by swabbing one of the following: The vagina or the lower part of the womb (cervix). The part of your body that drains urine from your bladder (urethra). Your eye. The upper part of your throat behind the nose (nasopharynx). The rectum. How do I prepare for this test? Do not urinate for an hour before the test. Try to arrive with a full bladder. Do not use vaginal creams or douches before the test. Tell a health care provider about: All medicines you are taking, including vitamins, herbs, eye drops, creams, and over-the-counter medicines. Any surgeries you have had. Any medical conditions you have. Whether you are pregnant or may be pregnant. How are the results reported? Your test results will be reported as either positive or negative for chlamydia. What do the results mean? A positive result means that you have a chlamydia infection. A negative result is normal and means that you do not have a chlamydia infection. Talk with your health care provider about what your results mean. Questions to ask your health care  provider Ask your health care provider, or the department that is doing the test: When will my results be ready? How will I get my results? What are my treatment options? What other tests do I need? What are my next steps? Summary This test may be done to see if you have chlamydia, which is a common STI (sexually transmitted infection). Depending on your symptoms, your health care provider may collect samples of urine or fluid. The fluid sample is collected by swabbing your vagina, cervix, urethra, eye, nasopharynx, or rectum. Your test results will be reported as either positive or negative for chlamydia. This information is not intended to replace advice given to you by your health care provider. Make sure you discuss any questions you have with your health care provider. Document Revised: 07/10/2020 Document Reviewed: 07/10/2020 Elsevier Patient Education  2022 [employer]  Fat and Cholesterol Restricted Eating Plan Getting too much fat and cholesterol in your diet may cause health problems. Choosing the right foods helps keep your fat and cholesterol at normal levels. This can keep you from getting certain diseases. Your doctor may recommend an eating plan that includes: Total fat: ______% or less of total calories a day. This is ______g of fat a day. Saturated fat: ______% or less of total calories a day. This is ______g of saturated fat a day. Cholesterol: less than _________mg a day. Fiber: ______g a day. What are tips for following this plan? General tips Work with your doctor to lose weight if you need to. Avoid: Foods with added sugar. Fried foods. Foods with trans fat or partially  hydrogenated oils. This includes some margarines and baked goods. If you drink alcohol: Limit how much you have to: 0-1 drink a day for women who are not pregnant. 0-2 drinks a day for men. Know how much alcohol is in a drink. In the [place] one drink equals one 12 oz bottle of beer (355 mL),  one 5 oz glass of wine (148 mL), or one 1 oz glass of hard liquor (44 mL). Reading food labels Check food labels for: Trans fats. Partially hydrogenated oils. Saturated fat (g) in each serving. Cholesterol (mg) in each serving. Fiber (g) in each serving. Choose foods with healthy fats, such as: Monounsaturated fats and polyunsaturated fats. These include olive and canola oil, flaxseeds, walnuts, almonds, and seeds. Omega-3 fats. These are found in certain fish, flaxseed oil, and ground flaxseeds. Choose grain products that have whole grains. Look for the word "whole" as the first word in the ingredient list. Cooking Cook foods using low-fat methods. These include baking, boiling, grilling, and broiling. Eat more home-cooked foods. Eat at restaurants and buffets less often. Eat less fast food. Avoid cooking using saturated fats, such as butter, cream, palm oil, palm kernel oil, and coconut oil. Meal planning  At meals, divide your plate into four equal parts: Fill one-half of your plate with vegetables, green salads, and fruit. Fill one-fourth of your plate with whole grains. Fill one-fourth of your plate with low-fat (lean) protein foods. Eat fish that is high in omega-3 fats at least two times a week. This includes mackerel, tuna, sardines, and salmon. Eat foods that are high in fiber, such as whole grains, beans, apples, pears, berries, broccoli, carrots, peas, and barley. What foods should I eat? Fruits All fresh, canned (in natural juice), or frozen fruits. Vegetables Fresh or frozen vegetables (raw, steamed, roasted, or grilled). Green salads. Grains Whole grains, such as whole wheat or whole grain breads, crackers, cereals, and pasta. Unsweetened oatmeal, bulgur, barley, quinoa, or brown rice. Corn or whole wheat flour tortillas. Meats and other protein foods Ground beef (85% or leaner), grass-fed beef, or beef trimmed of fat. Skinless chicken or [place]. Ground chicken or  [place]. Pork trimmed of fat. All fish and seafood. Egg whites. Dried beans, peas, or lentils. Unsalted nuts or seeds. Unsalted canned beans. Nut butters without added sugar or oil. Dairy Low-fat or nonfat dairy products, such as skim or 1% milk, 2% or reduced-fat cheeses, low-fat and fat-free ricotta or cottage cheese, or plain low-fat and nonfat yogurt. Fats and oils Tub margarine without trans fats. Light or reduced-fat mayonnaise and salad dressings. Avocado. Olive, canola, sesame, or safflower oils. The items listed above may not be a complete list of foods and beverages you can eat. Contact a dietitian for more information. What foods should I avoid? Fruits Canned fruit in heavy syrup. Fruit in cream or butter sauce. Fried fruit. Vegetables Vegetables cooked in cheese, cream, or butter sauce. Fried vegetables. Grains White bread. White pasta. White rice. Cornbread. Bagels, pastries, and croissants. Crackers and snack foods that contain trans fat and hydrogenated oils. Meats and other protein foods Fatty cuts of meat. Ribs, chicken wings, bacon, sausage, bologna, salami, chitterlings, fatback, hot dogs, bratwurst, and packaged lunch meats. Liver and organ meats. Whole eggs and egg yolks. Chicken and [place] with skin. Fried meat. Dairy Whole or 2% milk, cream, half-and-half, and cream cheese. Whole milk cheeses. Whole-fat or sweetened yogurt. Full-fat cheeses. Nondairy creamers and whipped toppings. Processed cheese, cheese spreads, and cheese curds. Fats and oils  Butter, stick margarine, lard, shortening, ghee, or bacon fat. Coconut, palm kernel, and palm oils. Beverages Alcohol. Sugar-sweetened drinks such as sodas, lemonade, and fruit drinks. Sweets and desserts Corn syrup, sugars, honey, and molasses. Candy. Jam and jelly. Syrup. Sweetened cereals. Cookies, pies, cakes, donuts, muffins, and ice cream. The items listed above may not be a complete list of foods and beverages you should  avoid. Contact a dietitian for more information. Summary Choosing the right foods helps keep your fat and cholesterol at normal levels. This can keep you from getting certain diseases. At meals, fill one-half of your plate with vegetables, green salads, and fruits. Eat high fiber foods, like whole grains, beans, apples, pears, berries, carrots, peas, and barley. Limit added sugar, saturated fats, alcohol, and fried foods. This information is not intended to replace advice given to you by your health care provider. Make sure you discuss any questions you have with your health care provider. Document Revised: 01/09/2021 Document Reviewed: 01/09/2021 Elsevier Patient Education  2022 [employer].

## 2021-08-11 ENCOUNTER — Other Ambulatory Visit: Payer: Self-pay | Admitting: Nurse Practitioner

## 2021-08-11 DIAGNOSIS — B009 Herpesviral infection, unspecified: Secondary | ICD-10-CM

## 2021-08-11 MED ORDER — VALACYCLOVIR HCL 1 G PO TABS: 1000.0000 mg | ORAL_TABLET | Freq: Two times a day (BID) | ORAL | 0 refills | Status: DC

## 2021-08-11 NOTE — Progress Notes (Signed)
I spoke with patient on the telephone at lunch to discuss the results that have already ome back. I added valacyclovir 1000mg  twice daily for 5 days and sent this to [facility] for her. I told her we were still waiting on results from urine STI screening.

## 2021-08-11 NOTE — Progress Notes (Signed)
Waiting on all results

## 2021-08-12 LAB — STI PROFILE, CT/NG/TV
Chlamydia by NAA: NEGATIVE
Gonococcus by NAA: NEGATIVE
HCV Ab: <0.1 s/co ratio (ref 0.0–0.9)
HIV Screen 4th Generation wRfx: NONREACTIVE
Hep B Core Total Ab: NEGATIVE
Hep B Surface Ab, Qual: NONREACTIVE
Hepatitis B Surface Ag: NEGATIVE
RPR Ser Ql: NONREACTIVE
Trich vag by NAA: NEGATIVE

## 2021-08-12 LAB — HSV-2 IGG SUPPLEMENTAL TEST: HSV-2 IgG Supplemental Test: POSITIVE — AB

## 2021-08-12 LAB — GC/CHLAMYDIA PROBE AMP
Chlamydia trachomatis, NAA: NEGATIVE
Neisseria Gonorrhoeae by PCR: NEGATIVE

## 2021-08-12 LAB — HCV INTERPRETATION

## 2021-08-12 LAB — HSV 2 ANTIBODY, IGG: HSV 2 IgG, Type Spec: 1.12 index — ABNORMAL HIGH (ref 0.00–0.90)

## 2021-08-17 NOTE — Progress Notes (Signed)
Hey. Will you let the patient know that the remaining labs for std screening were all negative? Thanks so much.   -HB

## 2021-09-11 ENCOUNTER — Encounter: Payer: Self-pay | Admitting: Nurse Practitioner

## 2021-09-11 ENCOUNTER — Ambulatory Visit (INDEPENDENT_AMBULATORY_CARE_PROVIDER_SITE_OTHER): Payer: Medicaid Other | Admitting: Nurse Practitioner

## 2021-09-11 ENCOUNTER — Other Ambulatory Visit: Payer: Self-pay

## 2021-09-11 VITALS — BP 131/74 | HR 105 | Temp 98.4°F | Ht 62.0 in | Wt 222.9 lb

## 2021-09-11 DIAGNOSIS — I499 Cardiac arrhythmia, unspecified: Secondary | ICD-10-CM | POA: Diagnosis not present

## 2021-09-11 DIAGNOSIS — I10 Essential (primary) hypertension: Secondary | ICD-10-CM | POA: Diagnosis not present

## 2021-09-11 DIAGNOSIS — Z6841 Body Mass Index (BMI) 40.0 and over, adult: Secondary | ICD-10-CM | POA: Diagnosis not present

## 2021-09-11 MED ORDER — PROPRANOLOL HCL ER 80 MG PO CP24: 80.0000 mg | ORAL_CAPSULE | Freq: Every day | ORAL | 1 refills | Status: DC

## 2021-09-11 NOTE — Patient Instructions (Addendum)
Hypertension, Adult High blood pressure (hypertension) is when the force of blood pumping through the arteries is too strong. The arteries are the blood vessels that carry blood from the heart throughout the body. Hypertension forces the heart to work harder to pump blood and may cause arteries to become narrow or stiff. Untreated or uncontrolled hypertension can cause a heart attack, heart failure, a stroke, kidney disease, and other problems. A blood pressure reading consists of a higher number over a lower number. Ideally, your blood pressure should be below 120/80. The first ("top") number is called the systolic pressure. It is a measure of the pressure in your arteries as your heart beats. The second ("bottom") number is called the diastolic pressure. It is a measure of the pressure in your arteries as the heart relaxes. What are the causes? The exact cause of this condition is not known. There are some conditions that result in or are related to high blood pressure. What increases the risk? Some risk factors for high blood pressure are under your control. The following factors may make you more likely to develop this condition: Smoking. Having type 2 diabetes mellitus, high cholesterol, or both. Not getting enough exercise or physical activity. Being overweight. Having too much fat, sugar, calories, or salt (sodium) in your diet. Drinking too much alcohol. Some risk factors for high blood pressure may be difficult or impossible to change. Some of these factors include: Having chronic kidney disease. Having a family history of high blood pressure. Age. Risk increases with age. Race. You may be at higher risk if you are African American. Gender. Men are at higher risk than women before age 20. After age 105, women are at higher risk than men. Having obstructive sleep apnea. Stress. What are the signs or symptoms? High blood pressure may not cause symptoms. Very high blood pressure  (hypertensive crisis) may cause: Headache. Anxiety. Shortness of breath. Nosebleed. Nausea and vomiting. Vision changes. Severe chest pain. Seizures. How is this diagnosed? This condition is diagnosed by measuring your blood pressure while you are seated, with your arm resting on a flat surface, your legs uncrossed, and your feet flat on the floor. The cuff of the blood pressure monitor will be placed directly against the skin of your upper arm at the level of your heart. It should be measured at least twice using the same arm. Certain conditions can cause a difference in blood pressure between your right and left arms. Certain factors can cause blood pressure readings to be lower or higher than normal for a short period of time: When your blood pressure is higher when you are in a health care provider's office than when you are at home, this is called white coat hypertension. Most people with this condition do not need medicines. When your blood pressure is higher at home than when you are in a health care provider's office, this is called masked hypertension. Most people with this condition may need medicines to control blood pressure. If you have a high blood pressure reading during one visit or you have normal blood pressure with other risk factors, you may be asked to: Return on a different day to have your blood pressure checked again. Monitor your blood pressure at home for 1 week or longer. If you are diagnosed with hypertension, you may have other blood or imaging tests to help your health care provider understand your overall risk for other conditions. How is this treated? This condition is treated by making  healthy lifestyle changes, such as eating healthy foods, exercising more, and reducing your alcohol intake. Your health care provider may prescribe medicine if lifestyle changes are not enough to get your blood pressure under control, and if: Your systolic blood pressure is above  130. Your diastolic blood pressure is above 80. Your personal target blood pressure may vary depending on your medical conditions, your age, and other factors. Follow these instructions at home: Eating and drinking  Eat a diet that is high in fiber and potassium, and low in sodium, added sugar, and fat. An example eating plan is called the DASH (Dietary Approaches to Stop Hypertension) diet. To eat this way: Eat plenty of fresh fruits and vegetables. Try to fill one half of your plate at each meal with fruits and vegetables. Eat whole grains, such as whole-wheat pasta, brown rice, or whole-grain bread. Fill about one fourth of your plate with whole grains. Eat or drink low-fat dairy products, such as skim milk or low-fat yogurt. Avoid fatty cuts of meat, processed or cured meats, and poultry with skin. Fill about one fourth of your plate with lean proteins, such as fish, chicken without skin, beans, eggs, or tofu. Avoid pre-made and processed foods. These tend to be higher in sodium, added sugar, and fat. Reduce your daily sodium intake. Most people with hypertension should eat less than 1,500 mg of sodium a day. Do not drink alcohol if: Your health care provider tells you not to drink. You are pregnant, may be pregnant, or are planning to become pregnant. If you drink alcohol: Limit how much you use to: 0-1 drink a day for women. 0-2 drinks a day for men. Be aware of how much alcohol is in your drink. In the [place] one drink equals one 12 oz bottle of beer (355 mL), one 5 oz glass of wine (148 mL), or one 1 oz glass of hard liquor (44 mL). Lifestyle  Work with your health care provider to maintain a healthy body weight or to lose weight. Ask what an ideal weight is for you. Get at least 30 minutes of exercise most days of the week. Activities may include walking, swimming, or biking. Include exercise to strengthen your muscles (resistance exercise), such as Pilates or lifting weights, as  part of your weekly exercise routine. Try to do these types of exercises for 30 minutes at least 3 days a week. Do not use any products that contain nicotine or tobacco, such as cigarettes, e-cigarettes, and chewing tobacco. If you need help quitting, ask your health care provider. Monitor your blood pressure at home as told by your health care provider. Keep all follow-up visits as told by your health care provider. This is important. Medicines Take over-the-counter and prescription medicines only as told by your health care provider. Follow directions carefully. Blood pressure medicines must be taken as prescribed. Do not skip doses of blood pressure medicine. Doing this puts you at risk for problems and can make the medicine less effective. Ask your health care provider about side effects or reactions to medicines that you should watch for. Contact a health care provider if you: Think you are having a reaction to a medicine you are taking. Have headaches that keep coming back (recurring). Feel dizzy. Have swelling in your ankles. Have trouble with your vision. Get help right away if you: Develop a severe headache or confusion. Have unusual weakness or numbness. Feel faint. Have severe pain in your chest or abdomen. Vomit repeatedly. Have trouble  breathing. Summary Hypertension is when the force of blood pumping through your arteries is too strong. If this condition is not controlled, it may put you at risk for serious complications. Your personal target blood pressure may vary depending on your medical conditions, your age, and other factors. For most people, a normal blood pressure is less than 120/80. Hypertension is treated with lifestyle changes, medicines, or a combination of both. Lifestyle changes include losing weight, eating a healthy, low-sodium diet, exercising more, and limiting alcohol. This information is not intended to replace advice given to you by your health care  provider. Make sure you discuss any questions you have with your health care provider. Document Revised: 05/10/2018 Document Reviewed: 05/10/2018 Elsevier Patient Education  2022 [employer]  Fat and Cholesterol Restricted Eating Plan Getting too much fat and cholesterol in your diet may cause health problems. Choosing the right foods helps keep your fat and cholesterol at normal levels. This can keep you from getting certain diseases. Your doctor may recommend an eating plan that includes: Total fat: ______% or less of total calories a day. This is ______g of fat a day. Saturated fat: ______% or less of total calories a day. This is ______g of saturated fat a day. Cholesterol: less than _________mg a day. Fiber: ______g a day. What are tips for following this plan? General tips Work with your doctor to lose weight if you need to. Avoid: Foods with added sugar. Fried foods. Foods with trans fat or partially hydrogenated oils. This includes some margarines and baked goods. If you drink alcohol: Limit how much you have to: 0-1 drink a day for women who are not pregnant. 0-2 drinks a day for men. Know how much alcohol is in a drink. In the [place] one drink equals one 12 oz bottle of beer (355 mL), one 5 oz glass of wine (148 mL), or one 1 oz glass of hard liquor (44 mL). Reading food labels Check food labels for: Trans fats. Partially hydrogenated oils. Saturated fat (g) in each serving. Cholesterol (mg) in each serving. Fiber (g) in each serving. Choose foods with healthy fats, such as: Monounsaturated fats and polyunsaturated fats. These include olive and canola oil, flaxseeds, walnuts, almonds, and seeds. Omega-3 fats. These are found in certain fish, flaxseed oil, and ground flaxseeds. Choose grain products that have whole grains. Look for the word "whole" as the first word in the ingredient list. Cooking Cook foods using low-fat methods. These include baking, boiling,  grilling, and broiling. Eat more home-cooked foods. Eat at restaurants and buffets less often. Eat less fast food. Avoid cooking using saturated fats, such as butter, cream, palm oil, palm kernel oil, and coconut oil. Meal planning  At meals, divide your plate into four equal parts: Fill one-half of your plate with vegetables, green salads, and fruit. Fill one-fourth of your plate with whole grains. Fill one-fourth of your plate with low-fat (lean) protein foods. Eat fish that is high in omega-3 fats at least two times a week. This includes mackerel, tuna, sardines, and salmon. Eat foods that are high in fiber, such as whole grains, beans, apples, pears, berries, broccoli, carrots, peas, and barley. What foods should I eat? Fruits All fresh, canned (in natural juice), or frozen fruits. Vegetables Fresh or frozen vegetables (raw, steamed, roasted, or grilled). Green salads. Grains Whole grains, such as whole wheat or whole grain breads, crackers, cereals, and pasta. Unsweetened oatmeal, bulgur, barley, quinoa, or brown rice. Corn or whole wheat flour tortillas.  Meats and other protein foods Ground beef (85% or leaner), grass-fed beef, or beef trimmed of fat. Skinless chicken or [place]. Ground chicken or [place]. Pork trimmed of fat. All fish and seafood. Egg whites. Dried beans, peas, or lentils. Unsalted nuts or seeds. Unsalted canned beans. Nut butters without added sugar or oil. Dairy Low-fat or nonfat dairy products, such as skim or 1% milk, 2% or reduced-fat cheeses, low-fat and fat-free ricotta or cottage cheese, or plain low-fat and nonfat yogurt. Fats and oils Tub margarine without trans fats. Light or reduced-fat mayonnaise and salad dressings. Avocado. Olive, canola, sesame, or safflower oils. The items listed above may not be a complete list of foods and beverages you can eat. Contact a dietitian for more information. What foods should I avoid? Fruits Canned fruit in heavy syrup.  Fruit in cream or butter sauce. Fried fruit. Vegetables Vegetables cooked in cheese, cream, or butter sauce. Fried vegetables. Grains White bread. White pasta. White rice. Cornbread. Bagels, pastries, and croissants. Crackers and snack foods that contain trans fat and hydrogenated oils. Meats and other protein foods Fatty cuts of meat. Ribs, chicken wings, bacon, sausage, bologna, salami, chitterlings, fatback, hot dogs, bratwurst, and packaged lunch meats. Liver and organ meats. Whole eggs and egg yolks. Chicken and [place] with skin. Fried meat. Dairy Whole or 2% milk, cream, half-and-half, and cream cheese. Whole milk cheeses. Whole-fat or sweetened yogurt. Full-fat cheeses. Nondairy creamers and whipped toppings. Processed cheese, cheese spreads, and cheese curds. Fats and oils Butter, stick margarine, lard, shortening, ghee, or bacon fat. Coconut, palm kernel, and palm oils. Beverages Alcohol. Sugar-sweetened drinks such as sodas, lemonade, and fruit drinks. Sweets and desserts Corn syrup, sugars, honey, and molasses. Candy. Jam and jelly. Syrup. Sweetened cereals. Cookies, pies, cakes, donuts, muffins, and ice cream. The items listed above may not be a complete list of foods and beverages you should avoid. Contact a dietitian for more information. Summary Choosing the right foods helps keep your fat and cholesterol at normal levels. This can keep you from getting certain diseases. At meals, fill one-half of your plate with vegetables, green salads, and fruits. Eat high fiber foods, like whole grains, beans, apples, pears, berries, carrots, peas, and barley. Limit added sugar, saturated fats, alcohol, and fried foods. This information is not intended to replace advice given to you by your health care provider. Make sure you discuss any questions you have with your health care provider. Document Revised: 01/09/2021 Document Reviewed: 01/09/2021 Elsevier Patient Education  2022 [employer].

## 2021-09-11 NOTE — Progress Notes (Signed)
Established Patient Office Visit  Subjective:  Patient ID: Kerry Richards, female    DOB: 08/10/1991  Age: 30 y.o. MRN: [id number]  CC:  Chief Complaint  Patient presents with   Hypertension    HPI [name] presents for follow-up of hypertension.  Currently taking propranolol ER 80 mg.  Blood pressure has responded well.  She reports no negative side effects.  She denies fatigue or dizziness.  R She does need refill for this today. She has no new concerns or complaints today.  Past Medical History:  Diagnosis Date   Anxiety    Hx of pyelonephritis    Hypertension    Migraine    Status post repeat low transverse cesarean section 11/29/2019   Trichomonas contact, treated    Vaginal Pap smear, abnormal     Past Surgical History:  Procedure Laterality Date   CESAREAN SECTION  08/24/2012   Procedure: CESAREAN SECTION;  Surgeon: Logan Bores, MD;  Location: [facility];  Service: Obstetrics;  Laterality: N/A;  Primary Cesarean Section Delivery Baby  Boy @ 0004, Apgars 9/9   CESAREAN SECTION WITH BILATERAL TUBAL LIGATION Bilateral 11/29/2019   Procedure: CESAREAN SECTION WITH BILATERAL TUBAL LIGATION;  Surgeon: Janyth Contes, MD;  Location: [facility];  Service: Obstetrics;  Laterality: Bilateral;  Tracey RNFA   DILATION AND CURETTAGE OF UTERUS     TOOTH EXTRACTION     WISDOM TOOTH EXTRACTION      Family History  Problem Relation Age of Onset   Hypertension Father    Migraines Father    Stroke Father    Arthritis Maternal Aunt    Hypertension Maternal Aunt    Arthritis Maternal Grandmother    Cancer Maternal Grandmother        lung   Diabetes Paternal Grandfather    Heart disease Paternal Grandfather    Hypertension Paternal Grandfather    Heart attack Paternal Grandfather    Heart disease Paternal Aunt    Heart attack Paternal Aunt    Depression Paternal Uncle    Suicidality Paternal Uncle    Anesthesia problems Neg Hx    Other Neg Hx     Social  History   Socioeconomic History   Marital status: Single    Spouse name: Not on file   Number of children: Not on file   Years of education: Not on file   Highest education level: Not on file  Occupational History   Not on file  Tobacco Use   Smoking status: Every Day    Packs/day: 0.50    Years: 14.00    Pack years: 7.00    Types: Cigarettes    Last attempt to quit: 09/30/2011    Years since quitting: 9.9   Smokeless tobacco: Never  Vaping Use   Vaping Use: Never used  Substance and Sexual Activity   Alcohol use: Yes    Alcohol/week: 3.0 standard drinks    Types: 3 Shots of liquor per week   Drug use: Yes    Types: Marijuana   Sexual activity: Yes    Birth control/protection: None  Other Topics Concern   Not on file  Social History Narrative   Not on file   Social Determinants of Health   Financial Resource Strain: Not on file  Food Insecurity: Not on file  Transportation Needs: Not on file  Physical Activity: Not on file  Stress: Not on file  Social Connections: Not on file  Intimate Partner Violence: Not on file  Outpatient Medications Prior to Visit  Medication Sig Dispense Refill   ibuprofen (ADVIL) 800 MG tablet Take 1 tablet (800 mg total) by mouth every 8 (eight) hours. 30 tablet 0   pantoprazole (PROTONIX) 40 MG tablet Take 1 tablet (40 mg total) by mouth daily. 30 tablet 3   valACYclovir (VALTREX) 1000 MG tablet Take 1 tablet (1,000 mg total) by mouth 2 (two) times daily. 10 tablet 0   doxycycline (VIBRA-TABS) 100 MG tablet Take 1 tablet (100 mg total) by mouth 2 (two) times daily. 14 tablet 0   propranolol ER (INDERAL LA) 80 MG 24 hr capsule TAKE 1 CAPSULE (80 MG TOTAL) BY MOUTH DAILY. 90 capsule 0   propranolol ER (INDERAL LA) 80 MG 24 hr capsule Take 1 capsule (80 mg total) by mouth daily. 90 capsule 0   No facility-administered medications prior to visit.    Allergies  Allergen Reactions   Metronidazole Hives   Shellfish Allergy Anaphylaxis    Amitriptyline Other (See Comments)    Nightmares     ROS Review of Systems  Constitutional:  Negative for activity change, appetite change, chills, fatigue and fever.  HENT:  Negative for congestion, postnasal drip, rhinorrhea, sinus pressure, sinus pain, sneezing and sore throat.   Eyes: Negative.   Respiratory:  Negative for cough, chest tightness, shortness of breath and wheezing.   Cardiovascular:  Negative for chest pain and palpitations.  Gastrointestinal:  Negative for abdominal pain, constipation, diarrhea, nausea and vomiting.  Endocrine: Negative for cold intolerance, heat intolerance, polydipsia and polyuria.  Genitourinary:  Negative for dyspareunia, dysuria, flank pain, frequency and urgency.  Musculoskeletal:  Negative for arthralgias, back pain and myalgias.  Skin:  Negative for rash.  Allergic/Immunologic: Negative for environmental allergies.  Neurological:  Negative for dizziness, weakness and headaches.  Hematological:  Negative for adenopathy.  Psychiatric/Behavioral:  The patient is not nervous/anxious.      Objective:    Physical Exam Vitals and nursing note reviewed.  Constitutional:      Appearance: Normal appearance. She is well-developed. She is obese.  HENT:     Head: Normocephalic and atraumatic.     Nose: Nose normal.     Mouth/Throat:     Mouth: Mucous membranes are moist.  Eyes:     Extraocular Movements: Extraocular movements intact.     Conjunctiva/sclera: Conjunctivae normal.     Pupils: Pupils are equal, round, and reactive to light.  Cardiovascular:     Rate and Rhythm: Normal rate and regular rhythm.     Pulses: Normal pulses.     Heart sounds: Normal heart sounds.  Pulmonary:     Effort: Pulmonary effort is normal.     Breath sounds: Normal breath sounds.  Abdominal:     Palpations: Abdomen is soft.  Musculoskeletal:        General: Normal range of motion.     Cervical back: Normal range of motion and neck supple.   Lymphadenopathy:     Cervical: No cervical adenopathy.  Skin:    General: Skin is warm and dry.     Capillary Refill: Capillary refill takes less than 2 seconds.  Neurological:     General: No focal deficit present.     Mental Status: She is alert and oriented to person, place, and time.  Psychiatric:        Mood and Affect: Mood normal.        Behavior: Behavior normal.        Thought Content: Thought content  normal.        Judgment: Judgment normal.    Today's Vitals   09/11/21 0850 09/11/21 0926  BP: (!) 160/84 131/74  Pulse: (!) 105   Temp: 98.4 F (36.9 C)   SpO2: 100%   Weight: 222 lb 14.4 oz (101.1 kg)   Height: 5' 2"  (1.575 m)    Body mass index is 40.77 kg/m.   Wt Readings from Last 3 Encounters:  09/11/21 222 lb 14.4 oz (101.1 kg)  08/10/21 224 lb (101.6 kg)  06/08/21 228 lb (103.4 kg)     Health Maintenance Due  Topic Date Due   COVID-19 Vaccine (1) Never done   TETANUS/TDAP  Never done   INFLUENZA VACCINE  04/13/2021    There are no preventive care reminders to display for this patient.  No results found for: TSH Lab Results  Component Value Date   WBC 11.3 (H) 03/20/2021   HGB 11.8 03/20/2021   HCT 37.1 03/20/2021   MCV 87 03/20/2021   PLT 372 03/20/2021   Lab Results  Component Value Date   NA 136 03/20/2021   K 4.3 03/20/2021   CO2 22 03/20/2021   GLUCOSE 109 (H) 03/20/2021   BUN 10 03/20/2021   CREATININE 0.63 03/20/2021   BILITOT 0.6 03/20/2021   ALKPHOS 105 03/20/2021   AST 10 03/20/2021   ALT 11 03/20/2021   PROT 7.2 03/20/2021   ALBUMIN 4.4 03/20/2021   CALCIUM 9.0 03/20/2021   ANIONGAP 10 11/29/2019   EGFR 122 03/20/2021   Lab Results  Component Value Date   CHOL 149 03/20/2021   Lab Results  Component Value Date   HDL 36 (L) 03/20/2021   Lab Results  Component Value Date   LDLCALC 94 03/20/2021   Lab Results  Component Value Date   TRIG 104 03/20/2021   Lab Results  Component Value Date   CHOLHDL 4.1  03/20/2021   No results found for: HGBA1C    Assessment & Plan:  1. Essential hypertension Well managed.  He continue propranolol ER 80 mg as previously prescribed.  Refills provided today. - propranolol ER (INDERAL LA) 80 MG 24 hr capsule; Take 1 capsule (80 mg total) by mouth daily.  Dispense: 90 capsule; Refill: 1  2. Irregular heart rhythm Well managed.  He continue propranolol ER 80 mg as previously prescribed.  Refills provided today. - propranolol ER (INDERAL LA) 80 MG 24 hr capsule; Take 1 capsule (80 mg total) by mouth daily.  Dispense: 90 capsule; Refill: 1  3. Body mass index (BMI) of 40.1-44.9 in adult [facility]) Discussed lowering calorie intake to 1500 calories per day and incorporating exercise into daily routine to help lose weight.   Problem List Items Addressed This Visit       Cardiovascular and Mediastinum   Essential hypertension - Primary   Relevant Medications   propranolol ER (INDERAL LA) 80 MG 24 hr capsule     Other   Irregular heart rhythm   Relevant Medications   propranolol ER (INDERAL LA) 80 MG 24 hr capsule   Body mass index (BMI) of 40.1-44.9 in adult [facility])    Meds ordered this encounter  Medications   propranolol ER (INDERAL LA) 80 MG 24 hr capsule    Sig: Take 1 capsule (80 mg total) by mouth daily.    Dispense:  90 capsule    Refill:  1    Please note increased dose    Order Specific Question:   Supervising Provider  Answer:   Beatrice Lecher D [[id number]]    Follow-up: Return in about 4 months (around 01/10/2022) for HTN.    Ronnell Freshwater, NP  This note was dictated using Systems analyst. Rapid proofreading was performed to expedite the delivery of the information. Despite proofreading, phonetic errors will occur which are common with this voice recognition software. Please take this into consideration. If there are any concerns, please contact our office.

## 2022-04-08 ENCOUNTER — Other Ambulatory Visit: Payer: Self-pay

## 2022-04-08 DIAGNOSIS — I10 Essential (primary) hypertension: Secondary | ICD-10-CM

## 2022-04-08 DIAGNOSIS — I499 Cardiac arrhythmia, unspecified: Secondary | ICD-10-CM

## 2022-04-08 DIAGNOSIS — K219 Gastro-esophageal reflux disease without esophagitis: Secondary | ICD-10-CM

## 2022-04-08 MED ORDER — PANTOPRAZOLE SODIUM 40 MG PO TBEC: 40.0000 mg | DELAYED_RELEASE_TABLET | Freq: Every day | ORAL | 0 refills | Status: DC

## 2022-04-08 MED ORDER — PROPRANOLOL HCL ER 80 MG PO CP24: 80.0000 mg | ORAL_CAPSULE | Freq: Every day | ORAL | 0 refills | Status: DC

## 2022-04-26 NOTE — Progress Notes (Unsigned)
Established patient visit   Patient: Kerry Richards   DOB: 1990/11/23   30 y.o. Female  MRN: [id number] Visit Date: 04/27/2022   No chief complaint on file.  Subjective    HPI  Follow up for hypertension  -typically well managed    Medications: Outpatient Medications Prior to Visit  Medication Sig   ibuprofen (ADVIL) 800 MG tablet Take 1 tablet (800 mg total) by mouth every 8 (eight) hours.   pantoprazole (PROTONIX) 40 MG tablet Take 1 tablet (40 mg total) by mouth daily.   propranolol ER (INDERAL LA) 80 MG 24 hr capsule Take 1 capsule (80 mg total) by mouth daily.   valACYclovir (VALTREX) 1000 MG tablet Take 1 tablet (1,000 mg total) by mouth 2 (two) times daily.   No facility-administered medications prior to visit.    Review of Systems  {Labs (Optional):23779}   Objective    There were no vitals taken for this visit. BP Readings from Last 3 Encounters:  09/11/21 131/74  08/10/21 133/80  06/08/21 (!) 142/91    Wt Readings from Last 3 Encounters:  09/11/21 222 lb 14.4 oz (101.1 kg)  08/10/21 224 lb (101.6 kg)  06/08/21 228 lb (103.4 kg)    Physical Exam  ***  No results found for any visits on 04/27/22.  Assessment & Plan     Problem List Items Addressed This Visit   None    No follow-ups on file.         Carlean Jews, NP  [facility] at [facility] [phone] (phone) [phone] (fax)  [facility]

## 2022-04-27 ENCOUNTER — Ambulatory Visit (INDEPENDENT_AMBULATORY_CARE_PROVIDER_SITE_OTHER): Payer: Medicaid Other | Admitting: Nurse Practitioner

## 2022-04-27 ENCOUNTER — Encounter: Payer: Self-pay | Admitting: Nurse Practitioner

## 2022-04-27 VITALS — BP 145/83 | HR 71 | Ht 61.81 in | Wt 234.4 lb

## 2022-04-27 DIAGNOSIS — G43109 Migraine with aura, not intractable, without status migrainosus: Secondary | ICD-10-CM | POA: Diagnosis not present

## 2022-04-27 DIAGNOSIS — R5383 Other fatigue: Secondary | ICD-10-CM

## 2022-04-27 DIAGNOSIS — I10 Essential (primary) hypertension: Secondary | ICD-10-CM

## 2022-04-27 DIAGNOSIS — R635 Abnormal weight gain: Secondary | ICD-10-CM

## 2022-04-27 DIAGNOSIS — I499 Cardiac arrhythmia, unspecified: Secondary | ICD-10-CM | POA: Diagnosis not present

## 2022-04-27 DIAGNOSIS — R7301 Impaired fasting glucose: Secondary | ICD-10-CM

## 2022-04-27 MED ORDER — PROPRANOLOL HCL ER 80 MG PO CP24: 80.0000 mg | ORAL_CAPSULE | Freq: Every day | ORAL | 3 refills | Status: DC

## 2022-04-28 LAB — TSH+FREE T4
Free T4: 1.38 ng/dL (ref 0.82–1.77)
TSH: 1.37 u[IU]/mL (ref 0.450–4.500)

## 2022-04-28 LAB — HEMOGLOBIN A1C
Est. average glucose Bld gHb Est-mCnc: 105 mg/dL
Hgb A1c MFr Bld: 5.3 % (ref 4.8–5.6)

## 2022-04-28 LAB — B12 AND FOLATE PANEL
Folate: 11.6 ng/mL (ref >3.0)
Vitamin B-12: 323 pg/mL (ref 232–1245)

## 2022-04-28 LAB — VITAMIN D 25 HYDROXY (VIT D DEFICIENCY, FRACTURES): Vit D, 25-Hydroxy: 30.7 ng/mL (ref 30.0–100.0)

## 2022-05-03 ENCOUNTER — Other Ambulatory Visit: Payer: Self-pay | Admitting: Nurse Practitioner

## 2022-05-03 ENCOUNTER — Telehealth: Payer: Self-pay | Admitting: Nurse Practitioner

## 2022-05-03 ENCOUNTER — Encounter: Payer: Self-pay | Admitting: Nurse Practitioner

## 2022-05-03 DIAGNOSIS — I499 Cardiac arrhythmia, unspecified: Secondary | ICD-10-CM

## 2022-05-03 DIAGNOSIS — I10 Essential (primary) hypertension: Secondary | ICD-10-CM

## 2022-05-03 MED ORDER — PROPRANOLOL HCL ER 120 MG PO CP24: 120.0000 mg | ORAL_CAPSULE | Freq: Every day | ORAL | 3 refills | Status: DC

## 2022-05-03 MED ORDER — PROPRANOLOL HCL ER 80 MG PO CP24: 80.0000 mg | ORAL_CAPSULE | Freq: Every day | ORAL | 3 refills | Status: DC

## 2022-05-03 NOTE — Progress Notes (Signed)
Sent propranolol Er 120 mg to [facility].

## 2022-05-03 NOTE — Progress Notes (Signed)
resent this to [facility] in [place].

## 2022-05-03 NOTE — Telephone Encounter (Signed)
Please let her know that I resent this to [facility] in [place].  Thanks  -HB

## 2022-05-03 NOTE — Progress Notes (Signed)
Changed dosing of propranolol ER to 120 mg daily. Sent new prescription to [facility] in [place].

## 2022-05-03 NOTE — Telephone Encounter (Signed)
The pharmacy never received her new prescription for her bp medication, can you please resend it?

## 2022-05-24 ENCOUNTER — Ambulatory Visit (INDEPENDENT_AMBULATORY_CARE_PROVIDER_SITE_OTHER): Payer: Medicaid Other | Admitting: Nurse Practitioner

## 2022-05-24 VITALS — BP 139/96 | HR 68 | Ht 61.81 in | Wt 237.1 lb

## 2022-05-24 DIAGNOSIS — Z111 Encounter for screening for respiratory tuberculosis: Secondary | ICD-10-CM | POA: Diagnosis not present

## 2022-05-24 NOTE — Addendum Note (Signed)
Addended by: Thad Ranger on: 05/24/2022 09:44 AM   Modules accepted: Level of Service

## 2022-05-24 NOTE — Progress Notes (Signed)
Patient is here for PPD placement pt tolerated injection well without complications.

## 2022-05-26 LAB — TB SKIN TEST: TB Skin Test: NEGATIVE

## 2022-06-07 NOTE — Progress Notes (Deleted)
Established patient visit   Patient: Kerry Richards   DOB: September 25, 1990   31 y.o. Female  MRN: [id number] Visit Date: 06/08/2022   No chief complaint on file.  Subjective    HPI  Follow up  Hypertension and migraines  -last visit, increased inderal ER to 80 mg daily  -labs done due to excess fatigue.  --all were normal.  --B12 low normal    Medications: Outpatient Medications Prior to Visit  Medication Sig   pantoprazole (PROTONIX) 40 MG tablet Take 1 tablet (40 mg total) by mouth daily.   propranolol ER (INDERAL LA) 120 MG 24 hr capsule Take 1 capsule (120 mg total) by mouth daily.   No facility-administered medications prior to visit.    Review of Systems  {Labs (Optional):23779}   Objective    There were no vitals taken for this visit. BP Readings from Last 3 Encounters:  05/24/22 (Abnormal) 139/96  04/27/22 (Abnormal) 145/83  09/11/21 131/74    Wt Readings from Last 3 Encounters:  05/24/22 237 lb 1.9 oz (107.6 kg)  04/27/22 234 lb 6.4 oz (106.3 kg)  09/11/21 222 lb 14.4 oz (101.1 kg)    Physical Exam  ***  No results found for any visits on 06/08/22.  Assessment & Plan     Problem List Items Addressed This Visit   None    No follow-ups on file.         Ronnell Freshwater, NP  [facility] Care at [facility] [phone] (phone) [phone] (fax)  [place]

## 2022-06-08 ENCOUNTER — Ambulatory Visit: Payer: Medicaid Other | Admitting: Nurse Practitioner

## 2022-07-23 ENCOUNTER — Other Ambulatory Visit: Payer: Self-pay | Admitting: Nurse Practitioner

## 2022-07-23 DIAGNOSIS — K219 Gastro-esophageal reflux disease without esophagitis: Secondary | ICD-10-CM

## 2022-07-26 MED ORDER — PANTOPRAZOLE SODIUM 40 MG PO TBEC: 40.0000 mg | DELAYED_RELEASE_TABLET | Freq: Every day | ORAL | 0 refills | Status: DC

## 2022-10-14 ENCOUNTER — Other Ambulatory Visit: Payer: Self-pay | Admitting: Nurse Practitioner

## 2022-10-14 DIAGNOSIS — I10 Essential (primary) hypertension: Secondary | ICD-10-CM

## 2022-10-14 DIAGNOSIS — I499 Cardiac arrhythmia, unspecified: Secondary | ICD-10-CM

## 2022-10-19 ENCOUNTER — Ambulatory Visit: Payer: Medicaid Other | Admitting: Nurse Practitioner

## 2022-10-19 ENCOUNTER — Encounter: Payer: Self-pay | Admitting: Nurse Practitioner

## 2022-10-19 VITALS — BP 136/93 | HR 98 | Ht 61.81 in | Wt 243.4 lb

## 2022-10-19 DIAGNOSIS — G43109 Migraine with aura, not intractable, without status migrainosus: Secondary | ICD-10-CM | POA: Diagnosis not present

## 2022-10-19 DIAGNOSIS — I499 Cardiac arrhythmia, unspecified: Secondary | ICD-10-CM

## 2022-10-19 DIAGNOSIS — I10 Essential (primary) hypertension: Secondary | ICD-10-CM | POA: Diagnosis not present

## 2022-10-19 MED ORDER — PROPRANOLOL HCL ER 120 MG PO CP24: 120.0000 mg | ORAL_CAPSULE | Freq: Every day | ORAL | 3 refills | Status: DC

## 2022-10-19 NOTE — Progress Notes (Signed)
Established patient visit   Patient: Kerry Richards   DOB: [birth year]   32 y.o. Female  MRN: [id number] Visit Date: 10/19/2022   Chief Complaint  Patient presents with   Follow-up   Blood Pressure Check   Subjective    HPI  Follow up -hypertension  --increased propranolol LA to 120 mg daily  --blood pressure elevated --has been out of blood pressure medication for several days.  -concern about 9 pound weight gain since she was last seen I  Medications: Outpatient Medications Prior to Visit  Medication Sig   pantoprazole (PROTONIX) 40 MG tablet Take 1 tablet (40 mg total) by mouth daily.   [DISCONTINUED] propranolol ER (INDERAL LA) 120 MG 24 hr capsule Take 1 capsule (120 mg total) by mouth daily.   No facility-administered medications prior to visit.    Review of Systems See HPI    Last CBC Lab Results  Component Value Date   WBC 11.3 (H) 03/20/2021   HGB 11.8 03/20/2021   HCT 37.1 03/20/2021   MCV 87 03/20/2021   MCH 27.6 03/20/2021   RDW 14.2 03/20/2021   PLT 372 [id number]   Last metabolic panel Lab Results  Component Value Date   GLUCOSE 109 (H) 03/20/2021   NA 136 03/20/2021   K 4.3 03/20/2021   CL 99 03/20/2021   CO2 22 03/20/2021   BUN 10 03/20/2021   CREATININE 0.63 03/20/2021   EGFR 122 03/20/2021   CALCIUM 9.0 03/20/2021   PROT 7.2 03/20/2021   ALBUMIN 4.4 03/20/2021   LABGLOB 2.8 03/20/2021   AGRATIO 1.6 03/20/2021   BILITOT 0.6 03/20/2021   ALKPHOS 105 03/20/2021   AST 10 03/20/2021   ALT 11 03/20/2021   ANIONGAP 10 11/29/2019   Last lipids Lab Results  Component Value Date   CHOL 149 03/20/2021   HDL 36 (L) 03/20/2021   LDLCALC 94 03/20/2021   TRIG 104 03/20/2021   CHOLHDL 4.1 03/20/2021   Last hemoglobin A1c Lab Results  Component Value Date   HGBA1C 5.3 04/27/2022   Last thyroid functions Lab Results  Component Value Date   TSH 1.370 04/27/2022   Last vitamin D Lab Results  Component Value Date   VD25OH 30.7  04/27/2022       Objective     Today's Vitals   10/19/22 1056 10/19/22 1128  BP: (Abnormal) 146/84 (Abnormal) 136/93  Pulse: 98   SpO2: 100%   Weight: 243 lb 6.4 oz (110.4 kg)   Height: 5' 1.81" (1.57 m)    Body mass index is 44.79 kg/m.  BP Readings from Last 3 Encounters:  11/09/22 (Abnormal) 151/91  10/28/22 138/86  10/19/22 (Abnormal) 136/93    Wt Readings from Last 3 Encounters:  11/09/22 242 lb 6.4 oz (110 kg)  10/28/22 237 lb (107.5 kg)  10/19/22 243 lb 6.4 oz (110.4 kg)    Physical Exam Vitals and nursing note reviewed.  Constitutional:      Appearance: Normal appearance. She is well-developed. She is obese.  HENT:     Head: Normocephalic and atraumatic.     Nose: Nose normal.     Mouth/Throat:     Mouth: Mucous membranes are moist.     Pharynx: Oropharynx is clear.  Eyes:     Extraocular Movements: Extraocular movements intact.     Conjunctiva/sclera: Conjunctivae normal.     Pupils: Pupils are equal, round, and reactive to light.  Cardiovascular:     Rate and Rhythm: Normal rate and regular rhythm.  Pulses: Normal pulses.     Heart sounds: Normal heart sounds.  Pulmonary:     Effort: Pulmonary effort is normal.     Breath sounds: Normal breath sounds.  Abdominal:     Palpations: Abdomen is soft.  Musculoskeletal:        General: Normal range of motion.     Cervical back: Normal range of motion and neck supple.  Lymphadenopathy:     Cervical: No cervical adenopathy.  Skin:    General: Skin is warm and dry.     Capillary Refill: Capillary refill takes less than 2 seconds.  Neurological:     General: No focal deficit present.     Mental Status: She is alert and oriented to person, place, and time.  Psychiatric:        Mood and Affect: Mood normal.        Behavior: Behavior normal.        Thought Content: Thought content normal.        Judgment: Judgment normal.       Assessment & Plan    1. Essential hypertension Overall, has improved  with increased dose Propranolol XR.  Continue as prescribed. Reassess in 1 week and consider stimulant medication to  help with weight management.    2. Migraine with aura and without status migrainosus, not intractable Improved. Continue propranolol XR 120 mg daily. Use OTC abortive therapy as needed and as  indicated.   3. Irregular heart rhythm Continue propranolol XR 120 mg daily.     Problem List Items Addressed This Visit       Cardiovascular and Mediastinum   Essential hypertension - Primary   Migraine with aura and without status migrainosus, not intractable     Other   Irregular heart rhythm     Return in about 1 week (around 10/26/2022) for blood pressure - see below .         Ronnell Freshwater, NP  [facility] Primary Care at [facility] [phone] (phone) [phone] (fax)  [place]

## 2022-10-28 ENCOUNTER — Ambulatory Visit: Payer: Medicaid Other | Admitting: Nurse Practitioner

## 2022-10-28 ENCOUNTER — Encounter: Payer: Self-pay | Admitting: Nurse Practitioner

## 2022-10-28 VITALS — BP 138/86 | HR 81 | Resp 18 | Ht 61.81 in | Wt 237.0 lb

## 2022-10-28 DIAGNOSIS — Z6841 Body Mass Index (BMI) 40.0 and over, adult: Secondary | ICD-10-CM

## 2022-10-28 DIAGNOSIS — I10 Essential (primary) hypertension: Secondary | ICD-10-CM

## 2022-10-28 DIAGNOSIS — I499 Cardiac arrhythmia, unspecified: Secondary | ICD-10-CM

## 2022-10-28 MED ORDER — PROPRANOLOL HCL ER 160 MG PO CP24: 160.0000 mg | ORAL_CAPSULE | Freq: Every day | ORAL | 2 refills | Status: DC

## 2022-10-28 MED ORDER — PHENTERMINE HCL 37.5 MG PO TABS: 37.5000 mg | ORAL_TABLET | Freq: Every day | ORAL | 1 refills | Status: DC

## 2022-10-28 NOTE — Progress Notes (Signed)
Established patient visit   Patient: Kerry Richards   DOB: Sep 04, 1991   32 y.o. Female  MRN: [id number] Visit Date: 10/28/2022   Chief Complaint  Patient presents with   Follow-up   Hypertension   Subjective    HPI  Follow up  -blood pressure -restarted propranolol.  --no negative side effects --blood pressure unchanged.   Would like to start phentermine -has been limiting calorie intake and increased physical activity.  -has lost six pounds since last time she was here one week ago.    Medications: Outpatient Medications Prior to Visit  Medication Sig   pantoprazole (PROTONIX) 40 MG tablet Take 1 tablet (40 mg total) by mouth daily.   [DISCONTINUED] propranolol ER (INDERAL LA) 120 MG 24 hr capsule Take 1 capsule (120 mg total) by mouth daily.   No facility-administered medications prior to visit.    Review of Systems  Constitutional:        Six pound weight loss   Cardiovascular:        Blood pressure continues to be elevated   All other systems reviewed and are negative.      Objective     Today's Vitals   10/28/22 1058 10/28/22 1105 10/28/22 1123  BP: (Abnormal) 146/94 (Abnormal) 145/92 138/86  Pulse: 81    Resp: 18    SpO2: 100%    Weight: 237 lb (107.5 kg)    Height: 5' 1.81" (1.57 m)     Body mass index is 43.61 kg/m.  BP Readings from Last 3 Encounters:  11/29/22 139/85  11/09/22 (Abnormal) 151/91  10/28/22 138/86    Wt Readings from Last 3 Encounters:  11/29/22 239 lb 12.8 oz (108.8 kg)  11/09/22 242 lb 6.4 oz (110 kg)  10/28/22 237 lb (107.5 kg)    Physical Exam Vitals and nursing note reviewed.  Constitutional:      Appearance: Normal appearance. She is well-developed. She is obese.  HENT:     Head: Normocephalic and atraumatic.     Nose: Nose normal.     Mouth/Throat:     Mouth: Mucous membranes are moist.     Pharynx: Oropharynx is clear.  Eyes:     Extraocular Movements: Extraocular movements intact.     Conjunctiva/sclera:  Conjunctivae normal.     Pupils: Pupils are equal, round, and reactive to light.  Cardiovascular:     Rate and Rhythm: Normal rate and regular rhythm.     Pulses: Normal pulses.     Heart sounds: Normal heart sounds.  Pulmonary:     Effort: Pulmonary effort is normal.     Breath sounds: Normal breath sounds.  Abdominal:     Palpations: Abdomen is soft.  Musculoskeletal:        General: Normal range of motion.     Cervical back: Normal range of motion and neck supple.  Lymphadenopathy:     Cervical: No cervical adenopathy.  Skin:    General: Skin is warm and dry.     Capillary Refill: Capillary refill takes less than 2 seconds.  Neurological:     General: No focal deficit present.     Mental Status: She is alert and oriented to person, place, and time.  Psychiatric:        Mood and Affect: Mood normal.        Behavior: Behavior normal.        Thought Content: Thought content normal.        Judgment: Judgment normal.  Assessment & Plan    1. Essential hypertension Improved. Increase propranolol ER to 160 mg daily. Limit salt and water in diet and increase exercise.  - propranolol ER (INDERAL LA) 160 MG SR capsule; Take 1 capsule (160 mg total) by mouth daily.  Dispense: 30 capsule; Refill: 2  2. Irregular heart rhythm Improved. Increase propranolol ER to 160 mg daily. - propranolol ER (INDERAL LA) 160 MG SR capsule; Take 1 capsule (160 mg total) by mouth daily.  Dispense: 30 capsule; Refill: 2  3. BMI 40.0-44.9, adult (HCC) Trial phentermine daily. Continue with low calorie, high-protein diet. Incorporate exercise into daily activities.      Problem List Items Addressed This Visit       Cardiovascular and Mediastinum   Essential hypertension - Primary   Relevant Medications   propranolol ER (INDERAL LA) 160 MG SR capsule     Other   Irregular heart rhythm   Relevant Medications   propranolol ER (INDERAL LA) 160 MG SR capsule   BMI 40.0-44.9, adult ([place])      Return in about 4 weeks (around 11/25/2022) for routine - weight management.         Ronnell Freshwater, NP  [facility] Primary Care at [facility] [phone] (phone) [phone] (fax)  [place]

## 2022-11-09 ENCOUNTER — Ambulatory Visit: Payer: Medicaid Other | Admitting: Nurse Practitioner

## 2022-11-09 ENCOUNTER — Encounter: Payer: Self-pay | Admitting: Nurse Practitioner

## 2022-11-09 VITALS — BP 151/91 | HR 72 | Ht 61.81 in | Wt 242.4 lb

## 2022-11-09 DIAGNOSIS — J039 Acute tonsillitis, unspecified: Secondary | ICD-10-CM | POA: Diagnosis not present

## 2022-11-09 DIAGNOSIS — J029 Acute pharyngitis, unspecified: Secondary | ICD-10-CM | POA: Insufficient documentation

## 2022-11-09 DIAGNOSIS — J3501 Chronic tonsillitis: Secondary | ICD-10-CM | POA: Insufficient documentation

## 2022-11-09 LAB — POCT RAPID STREP A (OFFICE): Rapid Strep A Screen: NEGATIVE

## 2022-11-09 MED ORDER — CEFTRIAXONE SODIUM 1 G IJ SOLR
1.0000 g | Freq: Once | INTRAMUSCULAR | Status: AC
  Administered 2022-11-09: 1 g via INTRAMUSCULAR

## 2022-11-09 MED ORDER — METHYLPREDNISOLONE SODIUM SUCC 125 MG IJ SOLR
125.0000 mg | Freq: Once | INTRAMUSCULAR | Status: AC
  Administered 2022-11-09: 125 mg via INTRAMUSCULAR

## 2022-11-09 MED ORDER — LEVOFLOXACIN 500 MG PO TABS: 500.0000 mg | ORAL_TABLET | Freq: Every day | ORAL | 0 refills | Status: AC

## 2022-11-09 MED ORDER — LIDOCAINE VISCOUS HCL 2 % MT SOLN: OROMUCOSAL | 0 refills | Status: DC

## 2022-11-09 MED ORDER — METHYLPREDNISOLONE 4 MG PO TBPK: ORAL_TABLET | ORAL | 0 refills | Status: DC

## 2022-11-09 NOTE — Progress Notes (Signed)
Established patient visit   Patient: Kerry Richards   DOB: November 20, 1990   32 y.o. Female  MRN: [id number] Visit Date: 11/09/2022   Chief Complaint  Patient presents with   Sore Throat   Subjective    Sore Throat  This is a recurrent problem. The current episode started in the past 7 days. The problem has been gradually worsening. Neither side of throat is experiencing more pain than the other. There has been no fever. The pain is at a severity of 8/10. Associated symptoms include congestion, coughing, ear pain, headaches, a hoarse voice, a plugged ear sensation, neck pain, swollen glands and trouble swallowing. She has tried acetaminophen and NSAIDs (antibiotics) for the symptoms. The treatment provided mild relief.    Follow up Urgent Care visit  -urgent care visit for tonsillitis.  --went to urgent care 11/02/2022 -strep negative during visit.  --was put on cefdinir 300 twice daily. States that she has not missed any doses.  -throat is still very sore. Hurts to swallow  -ears are hurting.  -feels tight in her neck  -initially had fever but has been taking tylenol and ibuprofen since then, so unsure if she still has fever    Medications: Outpatient Medications Prior to Visit  Medication Sig   pantoprazole (PROTONIX) 40 MG tablet Take 1 tablet (40 mg total) by mouth daily.   phentermine (ADIPEX-P) 37.5 MG tablet Take 1 tablet (37.5 mg total) by mouth daily before breakfast.   propranolol ER (INDERAL LA) 160 MG SR capsule Take 1 capsule (160 mg total) by mouth daily.   No facility-administered medications prior to visit.    Review of Systems  HENT:  Positive for congestion, ear pain, hoarse voice, sore throat and trouble swallowing.   Respiratory:  Positive for cough.   Musculoskeletal:  Positive for neck pain.  Neurological:  Positive for headaches.       Objective     Today's Vitals   11/09/22 1037 11/09/22 1117  BP: (Abnormal) 150/91 (Abnormal) 151/91  Pulse: 72    SpO2: 98%   Weight: 242 lb 6.4 oz (110 kg)   Height: 5' 1.81" (1.57 m)    Body mass index is 44.61 kg/m.  BP Readings from Last 3 Encounters:  11/09/22 (Abnormal) 151/91  10/28/22 138/86  10/19/22 (Abnormal) 136/93    Wt Readings from Last 3 Encounters:  11/09/22 242 lb 6.4 oz (110 kg)  10/28/22 237 lb (107.5 kg)  10/19/22 243 lb 6.4 oz (110.4 kg)    Physical Exam Vitals and nursing note reviewed.  Constitutional:      Appearance: Normal appearance. She is well-developed. She is obese. She is ill-appearing.  HENT:     Head: Normocephalic and atraumatic.     Right Ear: Tympanic membrane is erythematous and bulging.     Left Ear: Tympanic membrane is erythematous and bulging.     Nose: Congestion present.     Mouth/Throat:     Pharynx: Posterior oropharyngeal erythema present.     Tonsils: 2+ on the right. 2+ on the left.     Comments: Tonsils appear red and heterogenous in nature  Eyes:     Pupils: Pupils are equal, round, and reactive to light.  Cardiovascular:     Rate and Rhythm: Regular rhythm. Tachycardia present.     Pulses: Normal pulses.     Heart sounds: Normal heart sounds.  Pulmonary:     Effort: Pulmonary effort is normal.     Breath sounds: Normal breath sounds.  Abdominal:     Palpations: Abdomen is soft.  Musculoskeletal:        General: Normal range of motion.     Cervical back: Normal range of motion and neck supple.  Lymphadenopathy:     Cervical: Cervical adenopathy present.  Skin:    General: Skin is warm and dry.     Capillary Refill: Capillary refill takes less than 2 seconds.  Neurological:     General: No focal deficit present.     Mental Status: She is alert and oriented to person, place, and time.  Psychiatric:        Attention and Perception: Attention and perception normal.        Mood and Affect: Mood normal. Affect is tearful.        Speech: Speech normal.        Behavior: Behavior normal. Behavior is cooperative.        Thought  Content: Thought content normal.        Cognition and Memory: Cognition and memory normal.        Judgment: Judgment normal.     Results for orders placed or performed in visit on 11/09/22  POCT rapid strep A  Result Value Ref Range   Rapid Strep A Screen Negative Negative    Assessment & Plan    1. Sore throat Strep at urgent care last week and again today, both negative. Tonsils very enlarged, red, and heterogenous. 1 gram rocephin given IM in the office today along with solu-medrol 125 mg. Patient tolerated theses injections well.  - POCT rapid strep A - cefTRIAXone (ROCEPHIN) injection 1 g - methylPREDNISolone sodium succinate (SOLU-MEDROL) 125 mg/2 mL injection 125 mg  2. Tonsillitis with exudate 1 gram rocephin given IM in the office today along with solu-medrol 125 mg. Patient tolerated theses injections well.  Follow levofloxacin 500 mg daily for 7 days and medrol taper. Take as directed for 6 days. She should also gargle with viscous lidocaine up to every 3 hours as needed. Continue to take tylenol and ibuprofen as needed for pain and inflammation. Rest and increase fluids. Consider referral to ENT for further evaluation.  - levofloxacin (LEVAQUIN) 500 MG tablet; Take 1 tablet (500 mg total) by mouth daily for 7 days.  Dispense: 7 tablet; Refill: 0 - methylPREDNISolone (MEDROL) 4 MG TBPK tablet; Take by mouth as directed for 6 days  Dispense: 21 tablet; Refill: 0 - lidocaine (XYLOCAINE) 2 % solution; Gargle with 15 ml Q3hors as needed for sore throat. Spit out after gargle  Dispense: 100 mL; Refill: 0 - cefTRIAXone (ROCEPHIN) injection 1 g - methylPREDNISolone sodium succinate (SOLU-MEDROL) 125 mg/2 mL injection 125 mg   Problem List Items Addressed This Visit       Respiratory   Tonsillitis with exudate   Relevant Medications   levofloxacin (LEVAQUIN) 500 MG tablet   methylPREDNISolone (MEDROL) 4 MG TBPK tablet   lidocaine (XYLOCAINE) 2 % solution     Other   Sore  throat - Primary   Relevant Orders   POCT rapid strep A (Completed)     Return for prn worsening or persistent symptoms.         Ronnell Freshwater, NP  [facility] Primary Care at [facility] [phone] (phone) [phone] (fax)  [place]

## 2022-11-29 ENCOUNTER — Encounter: Payer: Self-pay | Admitting: Nurse Practitioner

## 2022-11-29 ENCOUNTER — Ambulatory Visit: Payer: Medicaid Other | Admitting: Nurse Practitioner

## 2022-11-29 VITALS — BP 139/85 | HR 75 | Ht 61.81 in | Wt 239.8 lb

## 2022-11-29 DIAGNOSIS — I10 Essential (primary) hypertension: Secondary | ICD-10-CM | POA: Diagnosis not present

## 2022-11-29 DIAGNOSIS — Z6841 Body Mass Index (BMI) 40.0 and over, adult: Secondary | ICD-10-CM

## 2022-11-29 DIAGNOSIS — J3501 Chronic tonsillitis: Secondary | ICD-10-CM

## 2022-11-29 MED ORDER — PHENTERMINE HCL 37.5 MG PO TABS: 37.5000 mg | ORAL_TABLET | Freq: Every day | ORAL | 1 refills | Status: DC

## 2022-11-29 NOTE — Progress Notes (Signed)
Established patient visit   Patient: Kerry Richards   DOB: Jun 26, 1991   32 y.o. Female  MRN: [id number] Visit Date: 11/29/2022   Chief Complaint  Patient presents with   Medical Management of Chronic Issues   Subjective    HPI  Follow up  -weight management  -started phentermine 10/28/2022 -starting weight 10/28/2022 - 237 -today's weight  - 11/29/2022 - 239  -negative side effects from starting phentermine  - none  -did not take phentermine for several days during the last month due to illness which she was seen for in the office.  -did have solu-medrol injection and medrol taper about 2 weeks ago due to illness.  - Hypertension -irregular heart rhythm  --on propranolol ER.  Frequent sore throats and tonsillitis -treated several times for this since beginning of the year.  -most recent visit, was treated with rocephin shot and solu-medrol injection. Was followed with Levaquin and medrol taper.   -states that she is feeling some better -throat is still frequently sore.  -often getting hoarse.  -neck area is tender to palpate.   Medications: Outpatient Medications Prior to Visit  Medication Sig   propranolol ER (INDERAL LA) 160 MG SR capsule Take 1 capsule (160 mg total) by mouth daily.   [DISCONTINUED] methylPREDNISolone (MEDROL) 4 MG TBPK tablet Take by mouth as directed for 6 days   [DISCONTINUED] pantoprazole (PROTONIX) 40 MG tablet Take 1 tablet (40 mg total) by mouth daily.   [DISCONTINUED] phentermine (ADIPEX-P) 37.5 MG tablet Take 1 tablet (37.5 mg total) by mouth daily before breakfast.   [DISCONTINUED] lidocaine (XYLOCAINE) 2 % solution Gargle with 15 ml Q3hors as needed for sore throat. Spit out after gargle   No facility-administered medications prior to visit.    Review of Systems  HENT:  Positive for sore throat and voice change.   All other systems reviewed and are negative.      Objective     Today's Vitals   11/29/22 1113 11/29/22 1145  BP:  (Abnormal) 142/92 139/85  Pulse: 75   SpO2: 100%   Weight: 239 lb 12.8 oz (108.8 kg)   Height: 5' 1.81" (1.57 m)    Body mass index is 44.13 kg/m.  BP Readings from Last 3 Encounters:  11/29/22 139/85  11/09/22 (Abnormal) 151/91  10/28/22 138/86    Wt Readings from Last 3 Encounters:  11/29/22 239 lb 12.8 oz (108.8 kg)  11/09/22 242 lb 6.4 oz (110 kg)  10/28/22 237 lb (107.5 kg)    Physical Exam Vitals and nursing note reviewed.  Constitutional:      Appearance: Normal appearance. She is well-developed. She is obese.  HENT:     Head: Normocephalic and atraumatic.     Mouth/Throat:     Pharynx: Pharyngeal swelling and posterior oropharyngeal erythema present.     Tonsils: 2+ on the right. 2+ on the left.  Eyes:     Pupils: Pupils are equal, round, and reactive to light.  Cardiovascular:     Rate and Rhythm: Normal rate and regular rhythm.     Pulses: Normal pulses.     Heart sounds: Normal heart sounds.  Pulmonary:     Effort: Pulmonary effort is normal.     Breath sounds: Normal breath sounds.  Abdominal:     Palpations: Abdomen is soft.  Musculoskeletal:        General: Normal range of motion.     Cervical back: Normal range of motion and neck supple.  Lymphadenopathy:  Cervical: No cervical adenopathy.  Skin:    General: Skin is warm and dry.     Capillary Refill: Capillary refill takes less than 2 seconds.  Neurological:     General: No focal deficit present.     Mental Status: She is alert and oriented to person, place, and time.  Psychiatric:        Mood and Affect: Mood normal.        Behavior: Behavior normal.        Thought Content: Thought content normal.        Judgment: Judgment normal.       Assessment & Plan    Chronic tonsillitis Assessment & Plan: Refer to ENT for further evaluation l  Orders: -     Ambulatory referral to ENT  Essential hypertension Assessment & Plan: Improved and generally stable. No changes to BP medication.  Continue to limit salt and increase water in the diet. Reassess at next visit.    BMI 40.0-44.9, adult [facility]) Assessment & Plan: May continue phentermine daily. Continue with low calorie, high-protein diet. Incorporate exercise into daily activities.    Orders: -     Phentermine HCl; Take 1 tablet (37.5 mg total) by mouth daily before breakfast.  Dispense: 30 tablet; Refill: 1     Return in about 6 weeks (around 01/10/2023) for routine - weight management, blood pressure.         Carlean Jews, NP  [facility] Primary Care at [facility] [phone] (phone) [phone] (fax)  [facility]

## 2022-12-19 ENCOUNTER — Other Ambulatory Visit: Payer: Self-pay | Admitting: Nurse Practitioner

## 2022-12-19 DIAGNOSIS — K219 Gastro-esophageal reflux disease without esophagitis: Secondary | ICD-10-CM

## 2022-12-20 MED ORDER — PANTOPRAZOLE SODIUM 40 MG PO TBEC
40.0000 mg | DELAYED_RELEASE_TABLET | Freq: Every day | ORAL | 0 refills | Status: DC
Start: 2022-12-20 — End: 2023-01-10

## 2023-01-02 NOTE — Assessment & Plan Note (Signed)
Improved and generally stable. No changes to BP medication. Continue to limit salt and increase water in the diet. Reassess at next visit.

## 2023-01-02 NOTE — Assessment & Plan Note (Signed)
May continue phentermine daily. Continue with low calorie, high-protein diet. Incorporate exercise into daily activities.

## 2023-01-02 NOTE — Assessment & Plan Note (Signed)
Refer to ENT for further evaluation l

## 2023-01-10 ENCOUNTER — Ambulatory Visit: Payer: Medicaid Other | Admitting: Nurse Practitioner

## 2023-01-10 ENCOUNTER — Encounter: Payer: Self-pay | Admitting: Nurse Practitioner

## 2023-01-10 VITALS — BP 145/93 | HR 82 | Ht 61.81 in | Wt 233.9 lb

## 2023-01-10 DIAGNOSIS — K219 Gastro-esophageal reflux disease without esophagitis: Secondary | ICD-10-CM

## 2023-01-10 DIAGNOSIS — Z6841 Body Mass Index (BMI) 40.0 and over, adult: Secondary | ICD-10-CM

## 2023-01-10 DIAGNOSIS — I499 Cardiac arrhythmia, unspecified: Secondary | ICD-10-CM

## 2023-01-10 DIAGNOSIS — I10 Essential (primary) hypertension: Secondary | ICD-10-CM

## 2023-01-10 MED ORDER — PANTOPRAZOLE SODIUM 40 MG PO TBEC
40.0000 mg | DELAYED_RELEASE_TABLET | Freq: Every day | ORAL | 5 refills | Status: DC
Start: 2023-01-10 — End: 2023-09-20

## 2023-01-10 MED ORDER — PROPRANOLOL HCL ER 120 MG PO CP24
120.0000 mg | ORAL_CAPSULE | Freq: Every day | ORAL | 5 refills | Status: DC
Start: 2023-01-10 — End: 2023-09-20

## 2023-01-10 NOTE — Progress Notes (Signed)
Established patient visit   Patient: Kerry Richards   DOB: 04-Oct-1990   32 y.o. Female  MRN: [id number] Visit Date: 01/10/2023   Chief Complaint  Patient presents with   Medical Management of Chronic Issues   Subjective    HPI  Follow up  Hypertension  -had increased dose due to elevated blood pressure to propranolol ER 160 mg.  -blood pressure mildly elevated today  -has been out of medication since last week and feels like the propranolol Er 160 mg has just been too much.  Weight management  -started phentermine 10/28/2022 -starting weight 10/28/2022 - 237 -most recent weight - 11/29/2022 - 239  -today's weight  - 01/10/2023 - 233 -negative side effects from starting phentermine  - swelling of hands and feet and ankles.  -has stopped taking phentermine due to the swelling of hands and feet.  -Would like to go back to normal blood pressure medication.  Medications: Outpatient Medications Prior to Visit  Medication Sig   gentamicin ointment (GARAMYCIN) 0.1 % 3 (three) times daily.   [DISCONTINUED] pantoprazole (PROTONIX) 40 MG tablet Take 1 tablet (40 mg total) by mouth daily.   [DISCONTINUED] propranolol ER (INDERAL LA) 160 MG SR capsule Take 1 capsule (160 mg total) by mouth daily.   phentermine (ADIPEX-P) 37.5 MG tablet Take 1 tablet (37.5 mg total) by mouth daily before breakfast. (Patient not taking: Reported on 01/10/2023)   No facility-administered medications prior to visit.    Review of Systems See HPI      Objective     Today's Vitals   01/10/23 1436  BP: (Abnormal) 145/93  Pulse: 82  SpO2: 100%  Weight: 233 lb 14.4 oz (106.1 kg)  Height: 5' 1.81" (1.57 m)   Body mass index is 43.04 kg/m.  BP Readings from Last 3 Encounters:  01/10/23 (Abnormal) 145/93  11/29/22 139/85  11/09/22 (Abnormal) 151/91    Wt Readings from Last 3 Encounters:  01/10/23 233 lb 14.4 oz (106.1 kg)  11/29/22 239 lb 12.8 oz (108.8 kg)  11/09/22 242 lb 6.4 oz (110 kg)     Physical Exam Vitals and nursing note reviewed.  Constitutional:      Appearance: Normal appearance. She is well-developed. She is obese.  HENT:     Head: Normocephalic and atraumatic.     Nose: Nose normal.     Mouth/Throat:     Mouth: Mucous membranes are moist.     Pharynx: Oropharynx is clear.  Eyes:     Extraocular Movements: Extraocular movements intact.     Conjunctiva/sclera: Conjunctivae normal.     Pupils: Pupils are equal, round, and reactive to light.  Neck:     Vascular: No carotid bruit.  Cardiovascular:     Rate and Rhythm: Normal rate and regular rhythm.     Pulses: Normal pulses.     Heart sounds: Normal heart sounds.  Pulmonary:     Effort: Pulmonary effort is normal.     Breath sounds: Normal breath sounds.  Abdominal:     Palpations: Abdomen is soft.  Musculoskeletal:        General: Normal range of motion.     Cervical back: Normal range of motion and neck supple.  Lymphadenopathy:     Cervical: No cervical adenopathy.  Skin:    General: Skin is warm and dry.     Capillary Refill: Capillary refill takes less than 2 seconds.  Neurological:     General: No focal deficit present.     Mental Status:  She is alert and oriented to person, place, and time.  Psychiatric:        Mood and Affect: Mood normal.        Behavior: Behavior normal.        Thought Content: Thought content normal.        Judgment: Judgment normal.      Assessment & Plan    Essential hypertension Assessment & Plan: Generally stable. -Continue propranolol ER 120 mg daily.  Orders: -     Propranolol HCl ER; Take 1 capsule (120 mg total) by mouth daily.  Dispense: 30 capsule; Refill: 5  Irregular heart rhythm Assessment & Plan: Generally stable. -Continue propranolol ER 120 mg daily.  Orders: -     Propranolol HCl ER; Take 1 capsule (120 mg total) by mouth daily.  Dispense: 30 capsule; Refill: 5  Gastroesophageal reflux disease without esophagitis Assessment &  Plan: Trial pantoprazole 40 mg daily. -Encouraged her to limit trigger foods. -Reassess in 3 months.  Orders: -     Pantoprazole Sodium; Take 1 tablet (40 mg total) by mouth daily.  Dispense: 30 tablet; Refill: 5  Class 3 severe obesity due to excess calories with serious comorbidity and body mass index (BMI) of 40.0 to 44.9 in adult [facility]) Assessment & Plan: Patient with hypertension and abnormal heart rhythm. Discussed lowering calorie intake to 1500 calories per day and incorporating exercise into daily routine to help lose weight.       Return in about 3 months (around 04/11/2023) for blood pressure.         Carlean Jews, NP  [facility] at [facility] [place] [phone] (phone) [phone] (fax)  [facility]

## 2023-01-30 NOTE — Assessment & Plan Note (Signed)
Trial pantoprazole 40 mg daily. -Encouraged her to limit trigger foods. -Reassess in 3 months.

## 2023-01-30 NOTE — Assessment & Plan Note (Signed)
Patient with hypertension and abnormal heart rhythm. Discussed lowering calorie intake to 1500 calories per day and incorporating exercise into daily routine to help lose weight.

## 2023-01-30 NOTE — Assessment & Plan Note (Signed)
Generally stable. -Continue propranolol ER 120 mg daily.

## 2023-01-30 NOTE — Assessment & Plan Note (Signed)
Generally stable. -Continue propranolol ER 120 mg daily. 

## 2023-04-11 ENCOUNTER — Ambulatory Visit: Payer: Medicaid Other | Admitting: Nurse Practitioner

## 2023-09-12 ENCOUNTER — Ambulatory Visit: Payer: Self-pay | Admitting: Family Medicine

## 2023-09-12 NOTE — Telephone Encounter (Deleted)
Copied from CRM [id number]. Topic: Appointments - Appointment Scheduling >> Sep 12, 2023  1:12 PM Kerry Richards C wrote: Patient/patient representative is calling to schedule an appointment and needs to be seen for her blood pressure medications and she has white puss pockets due to her strep throat.

## 2023-09-12 NOTE — Telephone Encounter (Signed)
Copied from CRM [id number]. Topic: Appointments - Appointment Scheduling >> Sep 12, 2023  1:12 PM Carloyn Manner C wrote: Patient/patient representative is calling to schedule an appointment and needs to be seen for her blood pressure medications and she has white puss pockets due to her strep throat. 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 She has completed both dosages on today. Blood Pressure Symptoms: Continue to  to see puss pocket  and more if larger and painful  to swallow Frequency:  Pertinent Negatives: Patient denies not being able to swallow. 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 SYMPTOM: "What's the main symptom you're concerned about?" (e.g., fever, difficulty swallowing, sore throat)      Sore throat and puss pocketed 2. ANTIBIOTIC: "What antibiotic are you taking?" "How many times a day?"     On last antibiotic  and don't feel like she is better. 3. ONSET: "When was the antibiotic started?"     On the las dose today 4. THROAT PAIN:  "How bad is the sore throat?" (Scale 1-10; mild, moderate or severe)    - MODERATE (4-7): Interferes with eating some solids and normal activities.    5. FEVER: "Do you have a fever?" If Yes, ask: "What is your temperature, how was it measured, and when did it start?"      Fever  at night. Night sweats 6. OTHER SYMPTOMS: "Do you have any other symptoms?" (e.g., rash)      7. BETTER Felt better at first but  feel like itis coming  back  on.-SAME-WORSE: "Are you getting better, staying the same, or getting worse compared to the day you started the antibiotics?"      8. PREGNANCY: "Is there any chance you are pregnant?" "When was your last menstrual period?"      Denies.  Protocols used: Strep Throat Infection on Antibiotic Follow-up Call-[facility]

## 2023-09-12 NOTE — Telephone Encounter (Signed)
Copied from CRM [id number]. Topic: Appointments - Appointment Scheduling >> Sep 12, 2023  1:12 PM Carloyn Manner C wrote: Patient/patient representative is calling to schedule an appointment and needs to be seen for her blood pressure medications and she has white puss pockets due to her strep throat. 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 She has completed both dosages on today. Blood Pressure Symptoms: Continue to  to see puss pocket  and more if larger and painful  to swallow Frequency:  Pertinent Negatives: Patient denies not being able to swallow. 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 [1] Taking antibiotic > 72 hours (3 days) for strep throat AND [2] sore throat not improved  Answer Assessment - Initial Assessment Questions 1. SYMPTOM: "What's the main symptom you're concerned about?" (e.g., fever, difficulty swallowing, sore throat)      Sore throat and puss pocketed 2. ANTIBIOTIC: "What antibiotic are you taking?" "How many times a day?"     On last antibiotic  and don't feel like she is better. 3. ONSET: "When was the antibiotic started?"     On the las dose today 4. THROAT PAIN:  "How bad is the sore throat?" (Scale 1-10; mild, moderate or severe)    - MODERATE (4-7): Interferes with eating some solids and normal activities.    5. FEVER: "Do you have a fever?" If Yes, ask: "What is your temperature, how was it measured, and when did it start?"      Fever  at night. Night sweats 6. OTHER SYMPTOMS: "Do you have any other symptoms?" (e.g., rash)      7. BETTER Felt better at first but  feel like itis coming  back  on.-SAME-WORSE: "Are you getting better, staying the same, or getting worse compared to the day you started the antibiotics?"      8. PREGNANCY: "Is there any chance you are pregnant?" "When was your last menstrual period?"      Denies.  Protocols used: Strep Throat Infection on Antibiotic Follow-up Call-[facility]     Reason for Disposition . [1] Taking antibiotic > 72 hours (3 days) for strep throat AND [2] sore throat not improved  Protocols used: Strep Throat Infection on Antibiotic Follow-up Call-[facility]

## 2023-09-20 ENCOUNTER — Encounter: Payer: Self-pay | Admitting: Family Medicine

## 2023-09-20 ENCOUNTER — Ambulatory Visit: Payer: Medicaid Other | Admitting: Family Medicine

## 2023-09-20 VITALS — BP 142/96 | HR 91 | Temp 98.8°F | Ht 61.81 in | Wt 231.8 lb

## 2023-09-20 DIAGNOSIS — K219 Gastro-esophageal reflux disease without esophagitis: Secondary | ICD-10-CM | POA: Diagnosis not present

## 2023-09-20 DIAGNOSIS — E66813 Obesity, class 3: Secondary | ICD-10-CM

## 2023-09-20 DIAGNOSIS — I10 Essential (primary) hypertension: Secondary | ICD-10-CM

## 2023-09-20 DIAGNOSIS — Z6841 Body Mass Index (BMI) 40.0 and over, adult: Secondary | ICD-10-CM

## 2023-09-20 DIAGNOSIS — R7301 Impaired fasting glucose: Secondary | ICD-10-CM

## 2023-09-20 MED ORDER — PROPRANOLOL HCL ER 120 MG PO CP24
120.0000 mg | ORAL_CAPSULE | Freq: Every day | ORAL | 0 refills | Status: DC
Start: 2023-09-20 — End: 2024-01-02

## 2023-09-20 MED ORDER — VALSARTAN 80 MG PO TABS
80.0000 mg | ORAL_TABLET | Freq: Every day | ORAL | 3 refills | Status: DC
Start: 2023-09-20 — End: 2023-09-30

## 2023-09-20 MED ORDER — PANTOPRAZOLE SODIUM 40 MG PO TBEC
40.0000 mg | DELAYED_RELEASE_TABLET | Freq: Every day | ORAL | 1 refills | Status: AC
Start: 2023-09-20 — End: ?

## 2023-09-20 NOTE — Assessment & Plan Note (Addendum)
 BP goal <140/90.  Discussed that beta-blockers are not the most effective monotherapy for blood pressure control.  Continue propranolol  120 mg daily, these were initially started by prior PCP for episodes of palpitations, ringing ears, seeing stars.  It is possible that these episodes are related to uncontrolled hypertension or they could be related to something like anxiety.  Start low-dose of valsartan  80 mg once daily for improved and more consistent blood pressure control.  Recheck CMP with lab work before annual physical.  If blood pressure well-controlled at that point, trial discontinuation of propranolol .

## 2023-09-20 NOTE — Progress Notes (Signed)
 Established Patient Office Visit  Subjective   Patient ID: Kerry Richards, female    DOB: 14-Apr-1991  Age: 33 y.o. MRN: [id number]  Chief Complaint  Patient presents with   Hypertension    HPI Kerry Richards is a 33 y.o. female presenting today for follow up of hypertension.  Currently taking propranolol  120 mg once daily, reports excellent compliance with medication routine.  Denies side effects.  She took her dose this morning about 2 hours ago.  Her blood pressure does tend to stay high propranolol .  Outpatient Medications Prior to Visit  Medication Sig   gentamicin ointment (GARAMYCIN) 0.1 % 3 (three) times daily.   loratadine (CLARITIN) 10 MG tablet Take 10 mg by mouth daily.   [DISCONTINUED] pantoprazole  (PROTONIX ) 40 MG tablet Take 1 tablet (40 mg total) by mouth daily.   [DISCONTINUED] propranolol  ER (INDERAL  LA) 120 MG 24 hr capsule Take 1 capsule (120 mg total) by mouth daily.   [DISCONTINUED] phentermine  (ADIPEX-P ) 37.5 MG tablet Take 1 tablet (37.5 mg total) by mouth daily before breakfast. (Patient not taking: Reported on 09/20/2023)   No facility-administered medications prior to visit.    ROS Negative unless otherwise noted in HPI   Objective:     BP (!) 142/96   Pulse 91   Temp 98.8 F (37.1 C) (Oral)   Ht 5' 1.81 (1.57 m)   Wt 231 lb 12 oz (105.1 kg)   SpO2 99%   BMI 42.65 kg/m   Physical Exam Constitutional:      General: She is not in acute distress.    Appearance: Normal appearance.  HENT:     Head: Normocephalic and atraumatic.  Cardiovascular:     Rate and Rhythm: Normal rate and regular rhythm.     Heart sounds: No murmur heard.    No friction rub. No gallop.  Pulmonary:     Effort: Pulmonary effort is normal. No respiratory distress.     Breath sounds: No wheezing, rhonchi or rales.  Skin:    General: Skin is warm and dry.  Neurological:     Mental Status: She is alert and oriented to person, place, and time.      Assessment & Plan:   Essential hypertension Assessment & Plan: BP goal <140/90.  Discussed that beta-blockers are not the most effective monotherapy for blood pressure control.  Continue propranolol  120 mg daily, these were initially started by prior PCP for episodes of palpitations, ringing ears, seeing stars.  It is possible that these episodes are related to uncontrolled hypertension or they could be related to something like anxiety.  Start low-dose of valsartan  80 mg once daily for improved and more consistent blood pressure control.  Recheck CMP with lab work before annual physical.  If blood pressure well-controlled at that point, trial discontinuation of propranolol .  Orders: -     Valsartan ; Take 1 tablet (80 mg total) by mouth daily.  Dispense: 90 tablet; Refill: 3 -     Propranolol  HCl ER; Take 1 capsule (120 mg total) by mouth daily.  Dispense: 90 capsule; Refill: 0  Gastroesophageal reflux disease without esophagitis Assessment & Plan: Patient's symptoms are severe enough that missing a single dose of pantoprazole  causes problems.  Continue pantoprazole  40 mg daily for now, check vitamin B12 with annual physical labs.  In the future, consider referral to gastroenterology for EGD.  Orders: -     Pantoprazole  Sodium; Take 1 tablet (40 mg total) by mouth daily.  Dispense: 90 tablet; Refill:  1    Return in about 3 months (around 12/19/2023) for annual physical, fasting blood work 1 week before.    Joesph DELENA Sear, PA

## 2023-09-20 NOTE — Patient Instructions (Signed)
 Right now, lets change to just 1 thing at a time.  Continue taking your propranolol  and start taking valsartan  once daily in the morning.  You can take the propranolol  and the valsartan  at the same time.  Continue checking your blood pressure at home.  If your blood pressure remains well-controlled, then the next time you are here we can try coming off of the propranolol  to simplify your medication regimen!

## 2023-09-20 NOTE — Assessment & Plan Note (Signed)
 Patient's symptoms are severe enough that missing a single dose of pantoprazole causes problems.  Continue pantoprazole 40 mg daily for now, check vitamin B12 with annual physical labs.  In the future, consider referral to gastroenterology for EGD.

## 2023-09-30 ENCOUNTER — Encounter: Payer: Self-pay | Admitting: Family Medicine

## 2023-09-30 ENCOUNTER — Ambulatory Visit: Payer: Medicaid Other | Admitting: Family Medicine

## 2023-09-30 DIAGNOSIS — I10 Essential (primary) hypertension: Secondary | ICD-10-CM

## 2023-09-30 MED ORDER — HYDROCHLOROTHIAZIDE 12.5 MG PO TABS: 12.5000 mg | ORAL_TABLET | Freq: Every day | ORAL | 2 refills | Status: DC

## 2023-10-20 ENCOUNTER — Encounter: Payer: Self-pay | Admitting: Family Medicine

## 2024-01-02 ENCOUNTER — Other Ambulatory Visit: Payer: Self-pay | Admitting: Family Medicine

## 2024-01-02 DIAGNOSIS — I10 Essential (primary) hypertension: Secondary | ICD-10-CM

## 2024-01-11 ENCOUNTER — Ambulatory Visit: Admitting: Family Medicine

## 2024-01-11 ENCOUNTER — Other Ambulatory Visit: Payer: Medicaid Other

## 2024-01-11 ENCOUNTER — Encounter: Payer: Self-pay | Admitting: Family Medicine

## 2024-01-11 VITALS — BP 136/92 | HR 92 | Ht 61.81 in | Wt 237.8 lb

## 2024-01-11 DIAGNOSIS — R0789 Other chest pain: Secondary | ICD-10-CM

## 2024-01-11 DIAGNOSIS — I10 Essential (primary) hypertension: Secondary | ICD-10-CM | POA: Diagnosis not present

## 2024-01-11 DIAGNOSIS — Z720 Tobacco use: Secondary | ICD-10-CM

## 2024-01-11 MED ORDER — NICOTINE 14 MG/24HR TD PT24: 14.0000 mg | MEDICATED_PATCH | Freq: Every day | TRANSDERMAL | 1 refills | Status: DC

## 2024-01-11 MED ORDER — AMLODIPINE BESYLATE 5 MG PO TABS: 5.0000 mg | ORAL_TABLET | Freq: Every day | ORAL | 3 refills | Status: DC

## 2024-01-11 MED ORDER — NICOTINE POLACRILEX 2 MG MT LOZG: 2.0000 mg | LOZENGE | OROMUCOSAL | 1 refills | Status: DC | PRN

## 2024-01-11 NOTE — Assessment & Plan Note (Signed)
-   Home BP readings 169/90s - Continue hydrochlorothiazide  12.5mg  dly - Add amlodipine 5mg  dly - Monitor BP twice daily for 2 weeks and record - Return in 6-8 weeks with BP log

## 2024-01-11 NOTE — Assessment & Plan Note (Signed)
-   Chest pain with exertion that improves with rest - Significant family history of cardiac disease: CVA in father, cardiac surgeries (valves?) and paternal grandfather and aunt in their 29s - EKG performed today - Cardiology referral for stress test

## 2024-01-11 NOTE — Progress Notes (Signed)
 Established Patient Office Visit  Subjective   Patient ID: Kerry Richards, female    DOB: 1991/02/11  Age: 33 y.o. MRN: [id number]  Chief Complaint  Patient presents with   Hypertension    HPI  Subjective: - Concerns: high blood pressure, heart fluttering/palpitations, smoking cessation - PMHx: hypertension,  - Meds: hydrochlorothiazide  12.5mg  dly, propranolol  (for HTN and migraines), pantoprazole  - Previously tried valsartan  in January but felt sluggish and lightheaded; discontinued - Reports home BP readings consistently elevated (169/90s) - [place] with sensation of heart "flipping" and stabbing pain; had to stop and rest - Reports pain persists in same location day after episode - Reports difficulty exercising due to these symptoms - Smoking: current smoker; wants to quit; reports hating it; only smoker in friend group - Family Hx: father had stroke; paternal grandfather and paternal aunt both had open heart surgery in 27s (valve replacements)  Review of Systems: - Constitutional symptoms: Fatigue - Cardiovascular: Chest pain with exertion, palpitations, sensation of heart "flipping" - Respiratory: Shortness of breath with exertion - Neurological: No history of seizures - Psychiatric: Reports stress   The ASCVD Risk score (Arnett DK, et al., 2019) failed to calculate for the following reasons:   The 2019 ASCVD risk score is only valid for ages 4 to 37  Health Maintenance Due  Topic Date Due   DTaP/Tdap/Td (6 - Tdap) 07/26/2000   Pneumococcal Vaccine 49-72 Years old (1 of 2 - PCV) Never done   COVID-19 Vaccine (1 - 2024-25 season) Never done      Objective:     BP (!) 136/92   Pulse 92   Ht 5' 1.81" (1.57 m)   Wt 237 lb 12 oz (107.8 kg)   LMP 01/08/2024   SpO2 99%   BMI 43.75 kg/m    Physical Exam General: Alert, oriented CV: Regular rhythm no murmurs. MSK: Mild tenderness palpation over the left sternal border Pulmonary:  Lungs clear bilaterally   No results found for any visits on 01/11/24.      Assessment & Plan:   Other chest pain Assessment & Plan: - Chest pain with exertion that improves with rest - Significant family history of cardiac disease: CVA in father, cardiac surgeries (valves?) and paternal grandfather and aunt in their 28s - EKG performed today - Cardiology referral for stress test  Orders: -     Apolipoprotein B -     Lipoprotein A (LPA)  Essential hypertension Assessment & Plan: - Home BP readings 169/90s - Continue hydrochlorothiazide  12.5mg  dly - Add amlodipine  5mg  dly - Monitor BP twice daily for 2 weeks and record - Return in 6-8 weeks with BP log   Tobacco use Assessment & Plan: Counseled on cessation.  Consider bupropion in the future but holding off now due to starting amlodipine  at this time.  Sending in prescription for nicotine  replacement.   Other orders -     Nicotine  Polacrilex; Take 1 lozenge (2 mg total) by mouth as needed for smoking cessation.  Dispense: 100 tablet; Refill: 1 -     Nicotine ; Place 1 patch (14 mg total) onto the skin daily.  Dispense: 28 patch; Refill: 1 -     amLODIPine  Besylate; Take 1 tablet (5 mg total) by mouth daily.  Dispense: 90 tablet; Refill: 3   I spent 33 minutes in the mgmt of this patient  Return in about 2 months (around 03/12/2024) for bp, chest pain.    Laneta Pintos,  MD

## 2024-01-11 NOTE — Assessment & Plan Note (Signed)
 Counseled on cessation.  Consider bupropion in the future but holding off now due to starting amlodipine at this time.  Sending in prescription for nicotine replacement.

## 2024-01-11 NOTE — Patient Instructions (Addendum)
 It was nice to see you today,  We addressed the following topics today: -We talked about several things today.  Below is a list of things we changed: 1.  I have added amlodipine 5 mg for your blood pressure take this daily. 2.  I would like you to check your blood pressure twice a day for the next 2 weeks and then return that value to us  3.  We are prescribing nicotine patch and lozenge to help quit smoking.  You can wear the patch all day and then if you get a craving of cigarettes at any point a day you can use the lozenge. 4.  You can call 1 800 quit NOW to get a month supply of free nicotine replacement 5.  I am sending in a referral to either a stress test or cardiology referral depending on which they would prefer.  Someone should call you to schedule this 6.  Your EKG was normal.   Have a great day,  Etha Henle, MD

## 2024-01-14 LAB — COMPREHENSIVE METABOLIC PANEL WITH GFR
ALT: 18 IU/L (ref 0–32)
AST: 17 IU/L (ref 0–40)
Albumin: 4.1 g/dL (ref 3.9–4.9)
Alkaline Phosphatase: 92 IU/L (ref 44–121)
BUN/Creatinine Ratio: 18 (ref 9–23)
BUN: 12 mg/dL (ref 6–20)
Bilirubin Total: <0.2 mg/dL (ref 0.0–1.2)
CO2: 24 mmol/L (ref 20–29)
Calcium: 9.2 mg/dL (ref 8.7–10.2)
Chloride: 100 mmol/L (ref 96–106)
Creatinine, Ser: 0.66 mg/dL (ref 0.57–1.00)
Globulin, Total: 2.9 g/dL (ref 1.5–4.5)
Glucose: 105 mg/dL — ABNORMAL HIGH (ref 70–99)
Potassium: 4.3 mmol/L (ref 3.5–5.2)
Sodium: 139 mmol/L (ref 134–144)
Total Protein: 7 g/dL (ref 6.0–8.5)
eGFR: 118 mL/min/{1.73_m2} (ref >59)

## 2024-01-14 LAB — CBC WITH DIFFERENTIAL/PLATELET
Basophils Absolute: 0.1 10*3/uL (ref 0.0–0.2)
Basos: 1 %
EOS (ABSOLUTE): 0.1 10*3/uL (ref 0.0–0.4)
Eos: 1 %
Hematocrit: 38.9 % (ref 34.0–46.6)
Hemoglobin: 13.1 g/dL (ref 11.1–15.9)
Immature Grans (Abs): 0 10*3/uL (ref 0.0–0.1)
Immature Granulocytes: 0 %
Lymphocytes Absolute: 1.6 10*3/uL (ref 0.7–3.1)
Lymphs: 18 %
MCH: 31.3 pg (ref 26.6–33.0)
MCHC: 33.7 g/dL (ref 31.5–35.7)
MCV: 93 fL (ref 79–97)
Monocytes Absolute: 0.7 10*3/uL (ref 0.1–0.9)
Monocytes: 8 %
Neutrophils Absolute: 6.4 10*3/uL (ref 1.4–7.0)
Neutrophils: 72 %
Platelets: 331 10*3/uL (ref 150–450)
RBC: 4.18 x10E6/uL (ref 3.77–5.28)
RDW: 13.1 % (ref 11.7–15.4)
WBC: 8.8 10*3/uL (ref 3.4–10.8)

## 2024-01-14 LAB — LIPOPROTEIN A (LPA): Lipoprotein (a): <8.4 nmol/L (ref <75.0)

## 2024-01-14 LAB — VITAMIN D 25 HYDROXY (VIT D DEFICIENCY, FRACTURES): Vit D, 25-Hydroxy: 37.7 ng/mL (ref 30.0–100.0)

## 2024-01-14 LAB — HEMOGLOBIN A1C
Est. average glucose Bld gHb Est-mCnc: 123 mg/dL
Hgb A1c MFr Bld: 5.9 % — ABNORMAL HIGH (ref 4.8–5.6)

## 2024-01-14 LAB — APOLIPOPROTEIN B: Apolipoprotein B: 101 mg/dL — ABNORMAL HIGH (ref <90)

## 2024-01-14 LAB — LIPID PANEL
Chol/HDL Ratio: 4 ratio (ref 0.0–4.4)
Cholesterol, Total: 191 mg/dL (ref 100–199)
HDL: 48 mg/dL (ref >39)
LDL Chol Calc (NIH): 124 mg/dL — ABNORMAL HIGH (ref 0–99)
Triglycerides: 106 mg/dL (ref 0–149)
VLDL Cholesterol Cal: 19 mg/dL (ref 5–40)

## 2024-01-14 LAB — TSH RFX ON ABNORMAL TO FREE T4: TSH: 0.941 u[IU]/mL (ref 0.450–4.500)

## 2024-01-14 LAB — VITAMIN B12: Vitamin B-12: 272 pg/mL (ref 232–1245)

## 2024-01-16 ENCOUNTER — Encounter: Payer: Self-pay | Admitting: Family Medicine

## 2024-01-16 ENCOUNTER — Other Ambulatory Visit: Payer: Self-pay | Admitting: Family Medicine

## 2024-01-16 DIAGNOSIS — I10 Essential (primary) hypertension: Secondary | ICD-10-CM

## 2024-01-18 ENCOUNTER — Encounter: Payer: Medicaid Other | Admitting: Family Medicine

## 2024-01-26 ENCOUNTER — Other Ambulatory Visit: Payer: Self-pay | Admitting: Family Medicine

## 2024-01-26 MED ORDER — OLMESARTAN MEDOXOMIL 20 MG PO TABS: 20.0000 mg | ORAL_TABLET | Freq: Every day | ORAL | 2 refills | Status: DC

## 2024-02-09 ENCOUNTER — Telehealth: Payer: Self-pay

## 2024-02-09 NOTE — Telephone Encounter (Signed)
PT IS ADVISED

## 2024-03-12 ENCOUNTER — Encounter: Payer: Self-pay | Admitting: Family Medicine

## 2024-03-12 ENCOUNTER — Ambulatory Visit: Admitting: Family Medicine

## 2024-03-12 VITALS — BP 134/90 | HR 75 | Ht 61.81 in

## 2024-03-12 DIAGNOSIS — N920 Excessive and frequent menstruation with regular cycle: Secondary | ICD-10-CM | POA: Insufficient documentation

## 2024-03-12 DIAGNOSIS — I10 Essential (primary) hypertension: Secondary | ICD-10-CM | POA: Diagnosis not present

## 2024-03-12 DIAGNOSIS — E782 Mixed hyperlipidemia: Secondary | ICD-10-CM | POA: Insufficient documentation

## 2024-03-12 DIAGNOSIS — R0789 Other chest pain: Secondary | ICD-10-CM | POA: Diagnosis not present

## 2024-03-12 MED ORDER — INDAPAMIDE 2.5 MG PO TABS: 2.5000 mg | ORAL_TABLET | Freq: Every day | ORAL | 2 refills | Status: DC

## 2024-03-12 NOTE — Assessment & Plan Note (Signed)
 Hypertension currently managed with propranolol  and hydrochlorothiazide . Reports home readings with systolic 170s-180s and diastolic low 90s. BP better in the office, but still elevated. Amlodipine  discontinued due to leg swelling, never started Olmesartan  due to pharmacy availability issues. Blood pressure remains elevated despite current regimen. - Switch hydrochlorothiazide  to indapamide for improved efficacy - Continue home blood pressure monitoring - Bring home blood pressure cuff to next visit for calibration comparison - Follow-up in two months

## 2024-03-12 NOTE — Assessment & Plan Note (Signed)
 No more episodes of chest pain since last visit.  Pt states she has not heard from cardiology but is hesitant to go now that symptoms have resolved.  Discussed CAC testing d/t her risk factors, elevated ldl and apo b, and family hx of early CAD in father. Will send in order for CAC CT

## 2024-03-12 NOTE — Assessment & Plan Note (Signed)
-   Order coronary artery calcium score CT scan ($99 out-of-pocket, not covered by insurance) - [facility] will contact for scheduling - If chest pain recurs with exertion, contact cardiology (referral remains active for one year)

## 2024-03-12 NOTE — Progress Notes (Signed)
 Established Patient Office Visit  Subjective   Patient ID: Kerry Richards, female    DOB: Jul 23, 1991  Age: 33 y.o. MRN: [id number]  Chief Complaint  Patient presents with   Medical Management of Chronic Issues    HPI  Subjective - Reports son broke arm on 02/21/2024, currently in third cast  - Follow-up for blood pressure and chest pain - Discontinued amlodipine  after one week due to severe leg and foot swelling - Never received Olmesartan  prescription from pharmacy - Reports feeling pretty good lately but experiencing stress - Recent return from beach, busy schedule, sleep issues - Home blood pressure readings: systolic 170s-180s, diastolic low 90s (worst reading 111)  - Chest pain not currently an issue - Reluctant to take additional medications - has not been contacted by cardiologist office yet.   - Heavy menstrual periods causing work absences - Large clots during seven-day periods described as dense with significant volume - History of migraines - Had tubal ligation, not on hormonal birth control.   Medications: propranolol , hydrochlorothiazide . Amlodipine  discontinued due to leg swelling. Olmesartan  prescribed but never obtained from pharmacy.  PMHx: hypertension, migraines, heavy menstrual bleeding. PSHx: tubal removal. FHx: heart disease in 40s. Social Hx: recent beach trip, increased stress levels.  ROS: denies current chest pain, leg swelling resolved after stopping amlodipine , reports heavy menstrual bleeding with large clots.   The ASCVD Risk score (Arnett DK, et al., 2019) failed to calculate for the following reasons:   The 2019 ASCVD risk score is only valid for ages 68 to 6  Health Maintenance Due  Topic Date Due   DTaP/Tdap/Td (6 - Tdap) 07/26/2000   Hepatitis B Vaccines (2 of 3 - 3-dose series) 07/15/2001   Pneumococcal Vaccine 51-7 Years old (1 of 2 - PCV) Never done   HPV VACCINES (1 - 3-dose SCDM series) Never done   COVID-19 Vaccine (1 -  2024-25 season) Never done      Objective:     BP (!) 134/90   Pulse 75   Ht 5' 1.81 (1.57 m)   LMP 03/12/2024   SpO2 100%   BMI 43.75 kg/m    Physical Exam Gen: alert, oriented Pulm: no resp distress Psych: pleasant affect.     No results found for any visits on 03/12/24.      Assessment & Plan:   Menorrhagia with regular cycle Assessment & Plan: Heavy menstrual bleeding with large clots causing functional impairment. History of migraines and hypertension contraindicate combined oral contraceptives. - Gynecology referral for evaluation and management options - Discussed tranexamic acid but prefer specialist evaluation given contraindications to hormonal options   Orders: -     Ambulatory referral to Gynecology  Mixed hyperlipidemia Assessment & Plan: - Order coronary artery calcium score CT scan ($99 out-of-pocket, not covered by insurance) - [place] [facility] will contact for scheduling - If chest pain recurs with exertion, contact cardiology (referral remains active for one year)  Orders: -     CT CARDIAC SCORING (SELF PAY ONLY); Future  Essential hypertension Assessment & Plan: Hypertension currently managed with propranolol  and hydrochlorothiazide . Reports home readings with systolic 170s-180s and diastolic low 90s. BP better in the office, but still elevated. Amlodipine  discontinued due to leg swelling, never started Olmesartan  due to pharmacy availability issues. Blood pressure remains elevated despite current regimen. - Switch hydrochlorothiazide  to indapamide for improved efficacy - Continue home blood pressure monitoring - Bring home blood pressure cuff to next visit for calibration comparison - Follow-up in two months  Other chest pain Assessment & Plan: No more episodes of chest pain since last visit.  Pt states she has not heard from cardiology but is hesitant to go now that symptoms have resolved.  Discussed CAC testing d/t her risk factors,  elevated ldl and apo b, and family hx of early CAD in father. Will send in order for CAC CT    Other orders -     Indapamide; Take 1 tablet (2.5 mg total) by mouth daily.  Dispense: 30 tablet; Refill: 2     Return in about 2 months (around 05/12/2024) for HTN.    Kerry MARLA Slain, MD

## 2024-03-12 NOTE — Assessment & Plan Note (Signed)
 Heavy menstrual bleeding with large clots causing functional impairment. History of migraines and hypertension contraindicate combined oral contraceptives. - Gynecology referral for evaluation and management options - Discussed tranexamic acid but prefer specialist evaluation given contraindications to hormonal options

## 2024-03-12 NOTE — Patient Instructions (Signed)
 It was nice to see you today,  We addressed the following topics today: -I am sending in a medication called indapamide to replace your hydrochlorothiazide .  You can take a half a tablet of the indapamide for the first week and then increase it to a full tablet. - I am sending in a order for a coronary artery calcium CT scan to help assess your risk of coronary artery disease.  Someone will call you to schedule this.  It is self-pay and cost about $99. - I am sending in a referral to a gynecologist to discuss TXA as an option for your heavy menstrual bleeding.  If you have somebody in mind you can call the referral coordinator where you would like to go.  Have a great day,  Rolan Slain, MD

## 2024-05-15 ENCOUNTER — Ambulatory Visit

## 2024-05-15 VITALS — BP 132/91 | HR 72 | Temp 98.1°F | Ht 62.0 in | Wt 237.0 lb

## 2024-05-15 DIAGNOSIS — Z23 Encounter for immunization: Secondary | ICD-10-CM

## 2024-05-15 DIAGNOSIS — K219 Gastro-esophageal reflux disease without esophagitis: Secondary | ICD-10-CM | POA: Diagnosis not present

## 2024-05-15 DIAGNOSIS — Z6841 Body Mass Index (BMI) 40.0 and over, adult: Secondary | ICD-10-CM

## 2024-05-15 DIAGNOSIS — E66813 Obesity, class 3: Secondary | ICD-10-CM

## 2024-05-15 DIAGNOSIS — I10 Essential (primary) hypertension: Secondary | ICD-10-CM | POA: Diagnosis not present

## 2024-05-15 DIAGNOSIS — Z716 Tobacco abuse counseling: Secondary | ICD-10-CM

## 2024-05-15 DIAGNOSIS — Z720 Tobacco use: Secondary | ICD-10-CM

## 2024-05-15 MED ORDER — HYDROCHLOROTHIAZIDE 25 MG PO TABS: 25.0000 mg | ORAL_TABLET | Freq: Every day | ORAL | 3 refills | Status: AC

## 2024-05-15 MED ORDER — WEGOVY 0.25 MG/0.5ML ~~LOC~~ SOAJ: 0.2500 mg | SUBCUTANEOUS | 2 refills | Status: DC

## 2024-05-15 NOTE — Progress Notes (Signed)
 Established Patient Office Visit  Subjective   Patient ID: Kerry Richards, female    DOB: 03-Feb-1991  Age: 33 y.o. MRN: [id number]  Chief Complaint  Patient presents with   Medical Management of Chronic Issues    HPI  History of Present Illness   Kerry Richards is a 33 year old female with hypertension who presents for blood pressure management and medication review.  Hypertension and antihypertensive medication tolerance - Hypertension since age 40 - Currently taking hydrochlorothiazide  12.5 mg, propranolol  ER 120 mg daily.  - Was supposed to swap out hydrochlorothiazide  for indapamide , but there was confusion on medication management at previous office visit. - Home blood pressure readings in the 130s to 140s systolic over 90s diastolic - Feels well on current regimen with good energy and no sluggishness - Alternates hydrochlorothiazide  and indapamide  due to side effects of swelling - Recent difficulty obtaining olmesartan  from pharmacy, with no updates received on stocking issues - Able to monitor blood pressure at home   Edema - Swelling in hands and feet by end of day when on multiple antihypertensive medications - Swelling improves with diuretic use  Weight management and physical activity - Concerned about difficulty with weight loss despite high activity level - Cares for five people daily, 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 - Only one cigarette in the past two weeks       ROS Per HPI.    Objective:     BP (!) 132/91   Pulse 72   Temp 98.1 F (36.7 C)  (Oral)   Ht 5' 2 (1.575 m)   Wt 237 lb 0.6 oz (107.5 kg)   LMP 05/02/2024   SpO2 99%   BMI 43.36 kg/m    Physical Exam Constitutional:      General: She is not in acute distress.    Appearance: Normal appearance.  Cardiovascular:     Rate and Rhythm: Normal rate and regular rhythm.     Heart sounds: Normal heart sounds. No murmur heard.    No friction rub. No gallop.  Pulmonary:     Effort: Pulmonary effort is normal. No respiratory distress.     Breath sounds: Normal breath sounds.  Musculoskeletal:        General: No swelling.  Skin:    General: Skin is warm and dry.  Neurological:     General: No focal deficit present.     Mental Status: She is alert.  Psychiatric:        Mood and Affect: Mood normal.        Behavior: Behavior normal.        Thought Content: Thought content normal.    No results found for any visits on 05/15/24.    The ASCVD Risk score (Arnett DK, et al., 2019) failed to calculate for the following reasons:   The 2019 ASCVD risk score is only valid for ages 91 to 44    Assessment & Plan:   Essential hypertension Assessment & Plan: BP Goal <140/90. Systolic at goal, diastolic above goal. Olmesartan  not obtained due to pharmacy issues. - Increase hydrochlorothiazide  to 25 mg daily.  Discontinue indapamide  due to intolerance. - Continue Propranolol  ER 120 mg daily.  - Monitor blood pressure with increased hydrochlorothiazide  dose. - Consider adding olmesartan  if blood pressure remains uncontrolled.   Encounter for vaccination -     Tdap vaccine greater than or equal to 7yo IM  BMI of 40.0 to 44.9 in adult [facility]) Assessment & Plan: Obesity management discussed with focus on weight loss. Interested in GLP-1 receptor agonist (Wegovy ). Discussed potential side effects and emphasized high protein intake. - Prescribe Wegovy , starting at 0.25 mg weekly. - Send prescription to [facility] for insurance approval and delivery. - Educate on  injection technique and potential side effects. - Discussed that this medication must be combined with low calorie diet (<1500 calories per day) and continued exercise (150+ minutes of moderate exercise per week). Patient verbalized understanding and was in agreement with the plan.  - Encourage high protein intake (80-100 grams daily) and adequate hydration. - Monitor for side effects and contact provider if severe nausea occurs. - Follow up in 10 weeks to assess progress and adjust treatment.   Gastroesophageal reflux disease without esophagitis Assessment & Plan: GERD improving with recent mild weight loss.  - Continue pantoprazole  as prescribed. - Monitor for changes in reflux symptoms with weight loss treatment.   Tobacco use Assessment & Plan: Counseled on cessation. Patient has reduced her use and has only smoked 1 cigarette in 2 weeks. Significantly reduced smoking. Previously used nicotine  patches but experienced lightheadedness.    Other orders -     Wegovy ; Inject 0.25 mg into the skin once a week.  Dispense: 2 mL; Refill: 2 -     hydroCHLOROthiazide ; Take 1 tablet (25 mg total) by mouth daily.  Dispense: 90 tablet; Refill: 3    Return in about 10 weeks (around 07/24/2024) for HTN, weight loss (needs CMP at appt).    Saddie JULIANNA Sacks, PA-C

## 2024-05-15 NOTE — Patient Instructions (Addendum)
 VISIT SUMMARY: Today, you came in for a follow-up on your blood pressure management and medication review. We discussed your current treatment for hypertension, issues with swelling, weight management, thyroid function, and smoking cessation.  YOUR PLAN: -HYPERTENSION: Hypertension, or high blood pressure, is being managed with hydrochlorothiazide . We will increase your dose to 25 mg daily and discontinue the medication causing swelling. Continue taking propranolol  daily. Discontinue taking Indapamide . Use your remaining 12.5 mg tablets by taking two until they are finished. Please monitor your blood pressure at home and recheck it in the office before you leave today. If your blood pressure remains high, we may consider adding olmesartan  or another similar medication.   -WEIGHT LOSS: You will start with a dose of 0.25 mg Wegovy  weekly. The prescription will be sent to [facility] for insurance approval and delivery. Make sure to follow the injection instructions and be aware of potential side effects like nausea. Aim for a high protein intake of 80-100 grams daily and stay well-hydrated. We will follow up in 10 weeks to see how you are doing. Miralax  as needed for constipation.   -GASTROESOPHAGEAL REFLUX DISEASE (GERD): GERD, or acid reflux, is being managed with pantoprazole . Continue taking it as prescribed and monitor for any changes in your symptoms, especially with the new weight loss treatment.  -CONSTIPATION: Constipation can be a side effect of your new weight loss medication, Wegovy . To help with this, consider taking Miralax  daily and continue using your current stool softeners as needed.  -TOBACCO USE: You have significantly reduced your smoking, which is great progress. You previously used nicotine  patches but experienced lightheadedness. Keep up the good work and continue to reduce your smoking.  -GENERAL HEALTH MAINTENANCE: You are due for a tetanus vaccination, which we will  administer today. This vaccine helps protect you from tetanus, a serious bacterial infection.  INSTRUCTIONS: Please recheck your blood pressure in the office before you leave today. Follow up in 10 weeks to assess your progress with the new weight loss medication and adjust treatment as needed. Monitor for any side effects and contact us  if you experience severe nausea or other concerns.  If you have any problems before your next visit feel free to message me via MyChart (minor issues or questions) or call the office, otherwise you may reach out to schedule an office visit.  Thank you! Saddie Sacks, PA-C

## 2024-05-15 NOTE — Assessment & Plan Note (Signed)
 GERD improving with recent mild weight loss.  - Continue pantoprazole  as prescribed. - Monitor for changes in reflux symptoms with weight loss treatment.

## 2024-05-15 NOTE — Assessment & Plan Note (Signed)
 Counseled on cessation. Patient has reduced her use and has only smoked 1 cigarette in 2 weeks. Significantly reduced smoking. Previously used nicotine  patches but experienced lightheadedness.

## 2024-05-15 NOTE — Assessment & Plan Note (Signed)
 BP Goal <140/90. Systolic at goal, diastolic above goal. Olmesartan  not obtained due to pharmacy issues. - Increase hydrochlorothiazide  to 25 mg daily. Discontinue indapamide  due to intolerance. - Continue Propranolol  ER 120 mg daily.  - Monitor blood pressure with increased hydrochlorothiazide  dose. - Consider adding olmesartan  if blood pressure remains uncontrolled.

## 2024-05-15 NOTE — Assessment & Plan Note (Addendum)
 Obesity management discussed with focus on weight loss. Interested in GLP-1 receptor agonist (Wegovy ). Discussed potential side effects and emphasized high protein intake. - Prescribe Wegovy , starting at 0.25 mg weekly. - Send prescription to [facility] for insurance approval and delivery. - Educate on injection technique and potential side effects. - Discussed that this medication must be combined with low calorie diet (<1500 calories per day) and continued exercise (150+ minutes of moderate exercise per week). Patient verbalized understanding and was in agreement with the plan.  - Encourage high protein intake (80-100 grams daily) and adequate hydration. - Monitor for side effects and contact provider if severe nausea occurs. - Follow up in 10 weeks to assess progress and adjust treatment.

## 2024-05-21 ENCOUNTER — Other Ambulatory Visit: Payer: Self-pay

## 2024-05-22 DIAGNOSIS — E66813 Obesity, class 3: Secondary | ICD-10-CM

## 2024-05-22 NOTE — Telephone Encounter (Signed)
 Contacted pt and informed her that the Wegovy  was approved and to call her pharmacy and confirm.

## 2024-05-28 MED ORDER — BUPROPION HCL ER (XL) 150 MG PO TB24: 150.0000 mg | ORAL_TABLET | Freq: Every day | ORAL | 1 refills | Status: DC

## 2024-05-28 NOTE — Addendum Note (Signed)
 Addended byBETHA GAYLE NUMBERS on: 05/28/2024 09:58 AM   Modules accepted: Orders

## 2024-06-04 ENCOUNTER — Other Ambulatory Visit: Payer: Self-pay | Admitting: Medical Genetics

## 2024-06-12 ENCOUNTER — Other Ambulatory Visit: Payer: Self-pay | Admitting: Family Medicine

## 2024-06-12 DIAGNOSIS — I10 Essential (primary) hypertension: Secondary | ICD-10-CM

## 2024-06-20 MED ORDER — BUPROPION HCL ER (XL) 300 MG PO TB24: 300.0000 mg | ORAL_TABLET | Freq: Every day | ORAL | 1 refills | Status: AC

## 2024-06-20 NOTE — Addendum Note (Signed)
 Addended byBETHA GAYLE NUMBERS on: 06/20/2024 01:24 PM   Modules accepted: Orders

## 2024-07-17 MED ORDER — WEGOVY 0.25 MG/0.5ML ~~LOC~~ SOAJ: 0.2500 mg | SUBCUTANEOUS | 0 refills | Status: DC

## 2024-07-17 MED ORDER — ONDANSETRON HCL 4 MG PO TABS: 4.0000 mg | ORAL_TABLET | Freq: Three times a day (TID) | ORAL | 0 refills | Status: DC | PRN

## 2024-07-17 NOTE — Addendum Note (Signed)
 Addended byBETHA GAYLE NUMBERS on: 07/17/2024 11:28 AM   Modules accepted: Orders

## 2024-07-17 NOTE — Addendum Note (Signed)
 Addended byBETHA GAYLE NUMBERS on: 07/17/2024 09:42 AM   Modules accepted: Orders

## 2024-07-24 ENCOUNTER — Ambulatory Visit (INDEPENDENT_AMBULATORY_CARE_PROVIDER_SITE_OTHER)

## 2024-07-24 VITALS — BP 132/80 | HR 96 | Temp 99.1°F | Ht 62.0 in | Wt 235.0 lb

## 2024-07-24 DIAGNOSIS — Z720 Tobacco use: Secondary | ICD-10-CM | POA: Diagnosis not present

## 2024-07-24 DIAGNOSIS — E66813 Obesity, class 3: Secondary | ICD-10-CM

## 2024-07-24 DIAGNOSIS — Z6841 Body Mass Index (BMI) 40.0 and over, adult: Secondary | ICD-10-CM

## 2024-07-24 DIAGNOSIS — K219 Gastro-esophageal reflux disease without esophagitis: Secondary | ICD-10-CM

## 2024-07-24 DIAGNOSIS — I10 Essential (primary) hypertension: Secondary | ICD-10-CM

## 2024-07-24 DIAGNOSIS — F339 Major depressive disorder, recurrent, unspecified: Secondary | ICD-10-CM | POA: Insufficient documentation

## 2024-07-24 NOTE — Patient Instructions (Signed)
 VISIT SUMMARY: Today, we reviewed your medication management, blood pressure, cardiovascular health, smoking cessation, anxiety, and recent lab results. We also discussed your upcoming biopsy and ultrasound following an abnormal Pap smear.  YOUR PLAN: OBESITY: You discontinued Wegovy  due to side effects and cost. Wellbutrin  is helping with appetite suppression and energy. Phentermine  was stopped due to swelling. -Continue taking Wellbutrin . -Consider reintroducing phentermine  if desired, but keep up the great work with exercising daily!  DEPRESSION: Your depression is well-managed with Wellbutrin , which is improving your mood, energy, and reducing anxiety. You have also maintained sobriety from alcohol and nicotine . -Continue Wellbutrin  at the current dose.  ESSENTIAL HYPERTENSION: Your blood pressure is well-controlled with propranolol  and hydrochlorothiazide . -Continue taking propranolol  and hydrochlorothiazide .  NICOTINE  DEPENDENCE, IN REMISSION: You have successfully quit smoking and no longer use nicotine  patches. -Continue to abstain from nicotine  use.  GASTROESOPHAGEAL REFLUX DISEASE (GERD): Your GERD is managed with pantoprazole . -Continue taking pantoprazole  as needed.  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 A biopsy and ultrasound are scheduled for today. -Proceed with the scheduled biopsy and ultrasound. -Continue regular Pap smears annually until results normalize.   If you have any problems before your next visit feel free to message me via MyChart (minor issues or questions) or call the office, otherwise you may reach out to schedule an office visit.  Thank you! Saddie Sacks, PA-C

## 2024-07-24 NOTE — Assessment & Plan Note (Signed)
 Nicotine  dependence in remission. Successfully quit smoking. - Continue to abstain from nicotine  use.

## 2024-07-24 NOTE — Assessment & Plan Note (Signed)
 Well-managed with Wellbutrin , improving mood, energy, and reducing anxiety. Maintained sobriety from alcohol and nicotine . - Continue Wellbutrin  at current dose.

## 2024-07-24 NOTE — Assessment & Plan Note (Signed)
 BP Goal < 140/90. BP within goal today. Patient never took olmesartan  due to pharmacy issues.  - Continue hydrochlorothiazide  25 mg daily and Propranolol  ER 120 mg  - Will cont to monitor

## 2024-07-24 NOTE — Progress Notes (Signed)
 Established Patient Office Visit  Subjective   Patient ID: Kerry Richards, female    DOB: 1990-11-10  Age: 33 y.o. MRN: [id number]  Chief Complaint  Patient presents with   Medical Management of Chronic Issues    HPI   History of Present Illness   Kerry Richards is a 33 year old female who presents for a follow-up regarding her medication management, specifically Wegovy  and Wellbutrin .  Weight management and pharmacotherapy - Discontinued Wegovy  due to significant gastrointestinal upset and prohibitive cost of $400 per month - Medicaid cessation of coverage for Wegovy  has further impacted ability to continue medication - Previously used phentermine , discontinued due to swelling in hands, feet, and ankles - Currently taking Wellbutrin , which has resulted in a slight decrease in appetite - Reports that the Wellbutrin  has also increased her energy levels which has helped her be able to exercise daily. She is walking at least a mile on the treadmill several times per week.   Blood pressure - Blood pressure has improved, with diastolic readings now in the low 80s - Current medications include propranolol  and hydrochlorothiazide   Smoking cessation and substance use - Wellbutrin  has aided in quitting smoking; no longer desires to smoke - No longer uses nicotine  patches - No alcohol consumption; feels this is the first time she has been sober since age 36  Anxiety and energy levels - Wellbutrin  has resulted in reduced anxiety and increased energy levels  Gynecologic abnormalities - Scheduled for biopsy and ultrasound today following abnormal Pap smear indicating HPV          ROS Per HPI.    Objective:     BP 132/80   Pulse 96   Temp 99.1 F (37.3 C) (Oral)   Ht 5' 2 (1.575 m)   Wt 235 lb (106.6 kg)   LMP 06/29/2024   SpO2 99%   BMI 42.98 kg/m    Physical Exam Constitutional:      General: She is not in acute distress.    Appearance: Normal appearance.   Cardiovascular:     Rate and Rhythm: Normal rate and regular rhythm.     Heart sounds: Normal heart sounds. No murmur heard.    No friction rub. No gallop.  Pulmonary:     Effort: Pulmonary effort is normal. No respiratory distress.     Breath sounds: Normal breath sounds.  Musculoskeletal:        General: No swelling.     Cervical back: Neck supple.  Lymphadenopathy:     Cervical: No cervical adenopathy.  Skin:    General: Skin is warm and dry.  Neurological:     General: No focal deficit present.     Mental Status: She is alert.  Psychiatric:        Mood and Affect: Mood normal.        Behavior: Behavior normal.        Thought Content: Thought content normal.      No results found for any visits on 07/24/24.    The ASCVD Risk score (Arnett DK, et al., 2019) failed to calculate for the following reasons:   The 2019 ASCVD risk score is only valid for ages 35 to 70    Assessment & Plan:   Essential hypertension Assessment & Plan: BP Goal < 140/90. BP within goal today. Patient never took olmesartan  due to pharmacy issues.  - Continue hydrochlorothiazide  25 mg daily and Propranolol  ER 120 mg  - Will cont to monitor  Orders: -  Comprehensive metabolic panel with GFR  BMI of 40.0 to 44.9 in adult [facility]) Assessment & Plan: Wegovy  discontinued due to cost and side effects. Wellbutrin  may aid appetite suppression and energy. - Continue Wellbutrin . - Consider reintroducing phentermine  if desired. - Continue with weight loss efforts including daily exercise, high protein/fiber diet.   Gastroesophageal reflux disease without esophagitis Assessment & Plan: GERD improving with recent mild weight loss.  - Continue pantoprazole  as prescribed.   Tobacco use Assessment & Plan: Nicotine  dependence in remission. Successfully quit smoking. - Continue to abstain from nicotine  use.   Depression, recurrent Assessment & Plan: Well-managed with Wellbutrin , improving  mood, energy, and reducing anxiety. Maintained sobriety from alcohol and nicotine . - Continue Wellbutrin  at current dose.      Return in about 4 months (around 11/21/2024) for Physical.    Saddie JULIANNA Sacks, PA-C

## 2024-07-24 NOTE — Assessment & Plan Note (Signed)
 Wegovy  discontinued due to cost and side effects. Wellbutrin  may aid appetite suppression and energy. - Continue Wellbutrin . - Consider reintroducing phentermine  if desired. - Continue with weight loss efforts including daily exercise, high protein/fiber diet.

## 2024-07-24 NOTE — Assessment & Plan Note (Signed)
 GERD improving with recent mild weight loss.  - Continue pantoprazole  as prescribed.

## 2024-07-26 ENCOUNTER — Other Ambulatory Visit: Payer: Self-pay | Admitting: Family Medicine

## 2024-07-26 MED ORDER — PHENTERMINE HCL 30 MG PO CAPS: 30.0000 mg | ORAL_CAPSULE | ORAL | 1 refills | Status: DC

## 2024-08-23 ENCOUNTER — Other Ambulatory Visit: Payer: Self-pay | Admitting: Family Medicine

## 2024-08-23 DIAGNOSIS — K219 Gastro-esophageal reflux disease without esophagitis: Secondary | ICD-10-CM

## 2024-09-12 ENCOUNTER — Other Ambulatory Visit: Payer: Self-pay

## 2024-09-12 MED ORDER — PHENTERMINE HCL 37.5 MG PO CAPS: 37.5000 mg | ORAL_CAPSULE | ORAL | 1 refills | Status: AC

## 2024-11-14 ENCOUNTER — Other Ambulatory Visit

## 2024-11-21 ENCOUNTER — Encounter
